# Patient Record
Sex: Male | Born: 1965 | Race: Asian | Hispanic: No | Marital: Married | Smoking: Never smoker
Health system: Southern US, Community
[De-identification: ages and names within clinical notes are randomized; demographics above are authoritative.]

## PROBLEM LIST (undated history)

## (undated) DIAGNOSIS — J45909 Unspecified asthma, uncomplicated: Secondary | ICD-10-CM

## (undated) DIAGNOSIS — E785 Hyperlipidemia, unspecified: Secondary | ICD-10-CM

## (undated) DIAGNOSIS — E119 Type 2 diabetes mellitus without complications: Secondary | ICD-10-CM

## (undated) DIAGNOSIS — I10 Essential (primary) hypertension: Secondary | ICD-10-CM

## (undated) HISTORY — DX: Hyperlipidemia, unspecified: E78.5

## (undated) HISTORY — DX: Type 2 diabetes mellitus without complications: E11.9

## (undated) HISTORY — DX: Unspecified asthma, uncomplicated: J45.909

## (undated) HISTORY — PX: APPENDECTOMY: SHX54

## (undated) HISTORY — DX: Essential (primary) hypertension: I10

---

## 2015-02-25 ENCOUNTER — Other Ambulatory Visit (INDEPENDENT_AMBULATORY_CARE_PROVIDER_SITE_OTHER): Payer: Self-pay | Admitting: General Surgery

## 2015-03-25 ENCOUNTER — Other Ambulatory Visit (INDEPENDENT_AMBULATORY_CARE_PROVIDER_SITE_OTHER): Payer: Self-pay

## 2015-03-25 DIAGNOSIS — Z01818 Encounter for other preprocedural examination: Secondary | ICD-10-CM

## 2015-03-31 ENCOUNTER — Inpatient Hospital Stay (HOSPITAL_COMMUNITY): Admission: RE | Admit: 2015-03-31 | Payer: Self-pay | Source: Ambulatory Visit

## 2015-03-31 ENCOUNTER — Ambulatory Visit (HOSPITAL_COMMUNITY): Admission: RE | Admit: 2015-03-31 | Payer: Self-pay | Source: Ambulatory Visit

## 2015-04-12 ENCOUNTER — Other Ambulatory Visit: Payer: Self-pay

## 2015-04-12 ENCOUNTER — Ambulatory Visit (HOSPITAL_COMMUNITY)
Admission: RE | Admit: 2015-04-12 | Discharge: 2015-04-12 | Disposition: A | Discharge status: Home / Self Care | Payer: 59 | Source: Ambulatory Visit | Attending: General Surgery | Admitting: General Surgery

## 2015-04-12 DIAGNOSIS — J45909 Unspecified asthma, uncomplicated: Secondary | ICD-10-CM | POA: Diagnosis not present

## 2015-04-12 DIAGNOSIS — J9811 Atelectasis: Secondary | ICD-10-CM | POA: Insufficient documentation

## 2015-04-12 DIAGNOSIS — E119 Type 2 diabetes mellitus without complications: Secondary | ICD-10-CM | POA: Diagnosis not present

## 2015-04-13 ENCOUNTER — Encounter: Payer: 59 | Attending: General Surgery | Admitting: Dietician

## 2015-04-13 ENCOUNTER — Encounter: Payer: Self-pay | Admitting: Dietician

## 2015-04-13 DIAGNOSIS — Z6841 Body Mass Index (BMI) 40.0 and over, adult: Secondary | ICD-10-CM | POA: Diagnosis not present

## 2015-04-13 DIAGNOSIS — Z01818 Encounter for other preprocedural examination: Secondary | ICD-10-CM | POA: Diagnosis present

## 2015-04-13 DIAGNOSIS — Z713 Dietary counseling and surveillance: Secondary | ICD-10-CM | POA: Insufficient documentation

## 2015-04-13 NOTE — Progress Notes (Signed)
  Pre-Op Assessment Visit:  Pre-Operative Gastric sleeve Surgery  Medical Nutrition Therapy:  Appt start time: 220   End time: 305  Patient was seen on 04/13/15 for Pre-Operative Nutrition Assessment. Assessment and [facility] on 04/13/15.   Preferred Learning Style:   No preference indicated   Learning Readiness:   Ready  Handouts given during visit include:  Pre-Op Goals Bariatric Surgery Protein Shakes Pre op Diet   During the appointment today the following Pre-Op Goals were reviewed with the patient: Maintain or lose weight as instructed by your surgeon Make healthy food choices Begin to limit portion sizes Limited concentrated sugars and fried foods Keep fat/sugar in the single digits per serving on   food labels Practice CHEWING your food  (aim for 30 chews per bite or until applesauce consistency) Practice not drinking 15 minutes before, during, and 30 minutes after each meal/snack Avoid all carbonated beverages  Avoid/limit caffeinated beverages  Avoid all sugar-sweetened beverages Consume 3 meals per day; eat every 3-5 hours Make a list of non-food related activities Aim for 64-100 ounces of FLUID daily  Aim for at least 60-80 grams of PROTEIN daily Look for a liquid protein source that contain ?15 g protein and ?5 g carbohydrate  (ex: shakes, drinks, shots)  Patient-Centered Goals: -Career -More fit overall  Scale of 1-10: confidence (10) / importance (10)  Demonstrated degree of understanding via:  Teach Back  Teaching Method Utilized:  Visual Auditory Hands on  Barriers to learning/adherence to lifestyle change: none  Patient to call the [facility] to enroll in Pre-Op and Post-Op Nutrition Education when surgery date is scheduled.

## 2015-05-12 ENCOUNTER — Other Ambulatory Visit: Payer: Self-pay | Admitting: General Surgery

## 2015-05-31 ENCOUNTER — Encounter: Payer: 59 | Attending: General Surgery

## 2015-05-31 DIAGNOSIS — Z6841 Body Mass Index (BMI) 40.0 and over, adult: Secondary | ICD-10-CM | POA: Diagnosis not present

## 2015-05-31 DIAGNOSIS — Z713 Dietary counseling and surveillance: Secondary | ICD-10-CM | POA: Diagnosis not present

## 2015-05-31 DIAGNOSIS — Z01818 Encounter for other preprocedural examination: Secondary | ICD-10-CM | POA: Diagnosis not present

## 2015-05-31 NOTE — Progress Notes (Signed)
  Pre-Operative Nutrition Class:  Appt start time: 5430   End time:  1830.  Patient was seen on 05/31/2015 for Pre-Operative Bariatric Surgery Education at the [facility].   Surgery date: 06/28/2015 Surgery type: Gastric sleeve Start weight at [facility]: 259.5 lbs on 04/13/2015 Weight today: 260.5 lbs  TANITA  BODY COMP RESULTS  05/31/15   BMI (kg/m^2) 42   Fat Mass (lbs) 83.5   Fat Free Mass (lbs) 177   Total Body Water (lbs) 129.5   Samples given per MNT protocol. Patient educated on appropriate usage: Celebrate Multivitamin chew (orange - qty 1) Lot #: [id number] Exp: 02/2017  Celebrate Calcium citrate chew (chocolate - qty 1) Lot #: [id number] Exp: 02/2017  Premier protein shake (strawberry - qty 1) Lot #: [id number] Exp: 07/2015  Renee Pain Protein Powder (unflavored - qty 1) Lot #: [id number] Exp: 06/2016  The following the learning objectives were met by the patient during this course:  Identify Pre-Op Dietary Goals and will begin 2 weeks pre-operatively  Identify appropriate sources of fluids and proteins   State protein recommendations and appropriate sources pre and post-operatively  Identify Post-Operative Dietary Goals and will follow for 2 weeks post-operatively  Identify appropriate multivitamin and calcium sources  Describe the need for physical activity post-operatively and will follow MD recommendations  State when to call healthcare provider regarding medication questions or post-operative complications  Handouts given during class include:  Pre-Op Bariatric Surgery Diet Handout  Protein Shake Handout  Post-Op Bariatric Surgery Nutrition Handout  BELT Program Information Flyer  Support Group Information Flyer  [facility] Bariatric Supplements Price List  Follow-Up Plan: Patient will follow-up at [facility] 2 weeks post operatively for diet advancement per MD.

## 2015-06-17 NOTE — Patient Instructions (Addendum)
Paul CamaraRolando Cassara  06/17/2015   Your procedure is scheduled on: Monday 06/28/2015    [facility]  [facility] to 3rd floor to  Short Stay Center at      [phone].  Call this number if you have problems the morning of surgery [phone]   Remember: ONLY 1 PERSON MAY GO WITH YOU TO SHORT STAY TO GET  READY MORNING OF YOUR SURGERY.   Do not eat food or drink liquids :After Midnight.     Take these medicines the morning of surgery with A SIP OF WATER:   Amlodipine ( Norvasc)                               You may not have any metal on your body including hair pins and              piercings  Do not wear jewelry, , lotions, powders or perfumes, deodorant           .              Men may shave face and neck.   Do not bring valuables to the hospital. [facility] IS NOT             RESPONSIBLE   FOR VALUABLES.  Contacts, dentures or bridgework may not be worn into surgery.  Leave suitcase in the car. After surgery it may be brought to your room.     :  Special Instructions: coughing and deep breathing exercises, leg exercises               Please read over the following fact sheets you were given: _____________________________________________________________________             [facility] - Preparing for [facility] Before surgery, you can play an important role.  Because skin is not sterile, your skin needs to be as free of germs as possible.  You can reduce the number of germs on your skin by washing with CHG (chlorahexidine gluconate) soap before surgery.  CHG is an antiseptic cleaner which kills germs and bonds with the skin to continue killing germs even after washing. Please DO NOT use if you have an allergy to CHG or antibacterial soaps.  If your skin becomes reddened/irritated stop using the CHG and inform your nurse when you arrive at Short Stay. Do not shave (including legs and underarms) for at least 48 hours prior to the first  CHG shower.  You may shave your face/neck. Please follow these instructions carefully:  1.  Shower with CHG Soap the night before surgery and the  morning of Surgery.  2.  If you choose to wash your hair, wash your hair first as usual with your  normal  shampoo.  3.  After you shampoo, rinse your hair and body thoroughly to remove the  shampoo.                           4.  Use CHG as you would any other liquid soap.  You can apply chg directly  to the skin and wash                       Gently with a scrungie or clean washcloth.  5.  Apply the CHG Soap to your body ONLY FROM THE NECK DOWN.   Do not use on face/ open                           Wound or open sores. Avoid contact with eyes, ears mouth and genitals (private parts).                       Wash face,  Genitals (private parts) with your normal soap.             6.  Wash thoroughly, paying special attention to the area where your surgery  will be performed.  7.  Thoroughly rinse your body with warm water from the neck down.  8.  DO NOT shower/wash with your normal soap after using and rinsing off  the CHG Soap.                9.  Pat yourself dry with a clean towel.            10.  Wear clean pajamas.            11.  Place clean sheets on your bed the night of your first shower and do not  sleep with pets. Day of Surgery : Do not apply any lotions/deodorants the morning of surgery.  Please wear clean clothes to the hospital/surgery center.  FAILURE TO FOLLOW THESE INSTRUCTIONS MAY RESULT IN THE CANCELLATION OF YOUR SURGERY PATIENT SIGNATURE_________________________________  NURSE SIGNATURE__________________________________  ________________________________________________________________________

## 2015-06-22 ENCOUNTER — Encounter (HOSPITAL_COMMUNITY): Payer: Self-pay

## 2015-06-22 ENCOUNTER — Encounter (HOSPITAL_COMMUNITY)
Admission: RE | Admit: 2015-06-22 | Discharge: 2015-06-22 | Disposition: A | Discharge status: Home / Self Care | Payer: 59 | Source: Ambulatory Visit | Attending: General Surgery | Admitting: General Surgery

## 2015-06-22 DIAGNOSIS — Z01812 Encounter for preprocedural laboratory examination: Secondary | ICD-10-CM | POA: Insufficient documentation

## 2015-06-22 LAB — CBC WITH DIFFERENTIAL/PLATELET
Basophils Absolute: 0 10*3/uL (ref 0.0–0.1)
Basophils Relative: 1 % (ref 0–1)
Eosinophils Absolute: 0.2 10*3/uL (ref 0.0–0.7)
Eosinophils Relative: 4 % (ref 0–5)
HEMATOCRIT: 48 % (ref 39.0–52.0)
HEMOGLOBIN: 15.4 g/dL (ref 13.0–17.0)
LYMPHS PCT: 22 % (ref 12–46)
Lymphs Abs: 1.3 10*3/uL (ref 0.7–4.0)
MCH: 25.1 pg — ABNORMAL LOW (ref 26.0–34.0)
MCHC: 32.1 g/dL (ref 30.0–36.0)
MCV: 78.3 fL (ref 78.0–100.0)
MONO ABS: 0.5 10*3/uL (ref 0.1–1.0)
MONOS PCT: 8 % (ref 3–12)
NEUTROS PCT: 65 % (ref 43–77)
Neutro Abs: 3.9 10*3/uL (ref 1.7–7.7)
Platelets: 188 10*3/uL (ref 150–400)
RBC: 6.13 MIL/uL — ABNORMAL HIGH (ref 4.22–5.81)
RDW: 15.8 % — ABNORMAL HIGH (ref 11.5–15.5)
WBC: 5.9 10*3/uL (ref 4.0–10.5)

## 2015-06-22 LAB — COMPREHENSIVE METABOLIC PANEL
ALT: 69 U/L — AB (ref 17–63)
AST: 35 U/L (ref 15–41)
Albumin: 4.2 g/dL (ref 3.5–5.0)
Alkaline Phosphatase: 40 U/L (ref 38–126)
Anion gap: 10 (ref 5–15)
BUN: 22 mg/dL — ABNORMAL HIGH (ref 6–20)
CO2: 27 mmol/L (ref 22–32)
Calcium: 9.1 mg/dL (ref 8.9–10.3)
Chloride: 100 mmol/L — ABNORMAL LOW (ref 101–111)
Creatinine, Ser: 1.03 mg/dL (ref 0.61–1.24)
GFR calc Af Amer: >60 mL/min (ref >60)
GFR calc non Af Amer: >60 mL/min (ref >60)
Glucose, Bld: 248 mg/dL — ABNORMAL HIGH (ref 65–99)
Potassium: 4.2 mmol/L (ref 3.5–5.1)
Sodium: 137 mmol/L (ref 135–145)
TOTAL PROTEIN: 7 g/dL (ref 6.5–8.1)
Total Bilirubin: 0.7 mg/dL (ref 0.3–1.2)

## 2015-06-22 NOTE — Progress Notes (Signed)
   06/22/15 1135  OBSTRUCTIVE SLEEP APNEA  Have you ever been diagnosed with sleep apnea through a sleep study? No  Do you snore loudly (loud enough to be heard through closed doors)?  1  Do you often feel tired, fatigued, or sleepy during the daytime? 0  Has anyone observed you stop breathing during your sleep? 0  Do you have, or are you being treated for high blood pressure? 1  BMI more than 35 kg/m2? 1  Age over 50 years old? 0  Neck circumference greater than 40 cm/16 inches? 1  Gender: 1

## 2015-06-22 NOTE — Progress Notes (Signed)
04/12/15-noted EKG and CXR in EPIC

## 2015-06-25 NOTE — H&P (Signed)
  History of Present Illness Paul Le(Talyn Dessert T. Nethaniel Mattie MD; 06/11/2015 11:40 AM) The patient is a 49 year old male who presents with obesity. He returns for a preop visit prior to planned sleeve gastrectomy for morbid obesity and comorbidities of adult-onset diabetes mellitus, hyperlipidemia and lower extremity edema. He has successfully completed his preoperative workup. His upper GI series was unremarkable. No concerns on Cycrin nutrition evaluation. We reviewed his lab work which was consistent with somewhat poorly controlled diabetes with elevated hemoglobin A1c of 9.5 and elevated glucose.   Allergies Paul Le(Ashley Beck, CMA; 06/11/2015 11:24 AM) No Known Drug Allergies03/09/2015  Medication History Paul Le(Ashley Beck, CMA; 06/11/2015 11:24 AM) Resveratrol (100MG  Capsule, Oral) Active. Hydrochlorothiazide (25MG  Tablet, Oral daily) Active. Amlodipine-Atorvastatin (10-20MG  Tablet, Oral) Active. Omega 3 (1000MG  Capsule, Oral) Active. Atorvastatin Calcium (10MG  Tablet, Oral) Active. MetFORMIN HCl (1000MG  Tablet, Oral) Active. Sleep Aid (DiphenhydrAMINE) (25MG  Tablet, Oral) Active. Meloxicam (7.5MG  Tablet, Oral) Active. Metoprolol Tartrate (50MG  Tablet, Oral) Active. B12-Active (1MG  Tablet Chewable, Oral) Active. Vitamin C (Oral) Active. Medications Reconciled  Vitals Paul Le(Ashley Beck CMA; 06/11/2015 11:24 AM) 06/11/2015 11:24 AM Weight: 260.8 lb Height: 66in Body Surface Area: 2.35 m Body Mass Index: 42.09 kg/m Temp.: 97.65F(Oral)  Pulse: 60 (Regular)  Resp.: 17 (Unlabored)  BP: 130/70 (Sitting, Left Arm, Standard)    Physical Exam Paul Le(Robertson Colclough T. Semisi Biela MD; 06/11/2015 11:41 AM) The physical exam findings are as follows: Note:General: Alert, morbidly obese Asian male, in no distress Skin: Warm and dry without rash or infection. HEENT: No palpable masses or thyromegaly. Sclera nonicteric. Pupils equal round and reactive. Oropharynx clear. Lungs: Breath sounds clear and  equal. No wheezing or increased work of breathing. Cardiovascular: Regular rate and rhythm without murmer. No JVD or edema. Peripheral pulses intact. No carotid bruits. Abdomen: Nondistended. Soft and nontender. No masses palpable. No organomegaly. No palpable hernias. Neurologic: Alert and fully oriented. Gait normal. No focal weakness. Psychiatric: Normal mood and affect. Thought content appropriate with normal judgement and insight    Assessment & Plan Paul Le(Avon Molock T. La Shehan MD; 06/11/2015 11:41 AM) MORBID OBESITY WITH BMI OF 40.0-44.9, ADULT (278.01  E66.01) Impression: Patient with progressive morbid obesity unresponsive to multiple efforts at medical management who presents with a BMI of 42 and comorbidities of diabetes mellitus and lower extremity edema. Workup complete and ready to proceed with laparoscopic sleeve gastrectomy. We reviewed the procedure and recovery in detail and all his questions were answered. Consent form was reviewed. He is given prescription for pain medication

## 2015-06-28 ENCOUNTER — Inpatient Hospital Stay (HOSPITAL_COMMUNITY): Payer: 59 | Admitting: Certified Registered Nurse Anesthetist

## 2015-06-28 ENCOUNTER — Inpatient Hospital Stay (HOSPITAL_COMMUNITY)
Admission: RE | Admit: 2015-06-28 | Discharge: 2015-06-30 | DRG: 621 | Disposition: A | Discharge status: Home / Self Care | Payer: 59 | Source: Ambulatory Visit | Attending: General Surgery | Admitting: General Surgery

## 2015-06-28 ENCOUNTER — Encounter (HOSPITAL_COMMUNITY)
Admission: RE | Disposition: A | Discharge status: Home / Self Care | Payer: Self-pay | Source: Ambulatory Visit | Attending: General Surgery

## 2015-06-28 ENCOUNTER — Encounter (HOSPITAL_COMMUNITY): Payer: Self-pay | Admitting: *Deleted

## 2015-06-28 DIAGNOSIS — K209 Esophagitis, unspecified: Secondary | ICD-10-CM | POA: Diagnosis present

## 2015-06-28 DIAGNOSIS — I10 Essential (primary) hypertension: Secondary | ICD-10-CM | POA: Diagnosis present

## 2015-06-28 DIAGNOSIS — E780 Pure hypercholesterolemia: Secondary | ICD-10-CM | POA: Diagnosis present

## 2015-06-28 DIAGNOSIS — Z794 Long term (current) use of insulin: Secondary | ICD-10-CM

## 2015-06-28 DIAGNOSIS — E1165 Type 2 diabetes mellitus with hyperglycemia: Secondary | ICD-10-CM | POA: Diagnosis present

## 2015-06-28 DIAGNOSIS — Z9884 Bariatric surgery status: Secondary | ICD-10-CM

## 2015-06-28 DIAGNOSIS — Z6841 Body Mass Index (BMI) 40.0 and over, adult: Secondary | ICD-10-CM | POA: Diagnosis not present

## 2015-06-28 DIAGNOSIS — R600 Localized edema: Secondary | ICD-10-CM | POA: Diagnosis present

## 2015-06-28 DIAGNOSIS — E785 Hyperlipidemia, unspecified: Secondary | ICD-10-CM | POA: Diagnosis present

## 2015-06-28 HISTORY — PX: LAPAROSCOPIC GASTRIC SLEEVE RESECTION: SHX5895

## 2015-06-28 LAB — GLUCOSE, CAPILLARY
Glucose-Capillary: 174 mg/dL — ABNORMAL HIGH (ref 65–99)
Glucose-Capillary: 263 mg/dL — ABNORMAL HIGH (ref 65–99)
Glucose-Capillary: 231 mg/dL — ABNORMAL HIGH (ref 65–99)
Glucose-Capillary: 205 mg/dL — ABNORMAL HIGH (ref 65–99)

## 2015-06-28 LAB — HEMOGLOBIN AND HEMATOCRIT, BLOOD
HEMATOCRIT: 48.3 % (ref 39.0–52.0)
HEMOGLOBIN: 16 g/dL (ref 13.0–17.0)

## 2015-06-28 SURGERY — GASTRECTOMY, SLEEVE, LAPAROSCOPIC
Anesthesia: General | Site: Abdomen

## 2015-06-28 MED ORDER — INSULIN ASPART 100 UNIT/ML ~~LOC~~ SOLN
0.0000 [IU] | SUBCUTANEOUS | Status: DC
  Administered 2015-06-28 (×2): 7 [IU] via SUBCUTANEOUS
  Administered 2015-06-29: 4 [IU] via SUBCUTANEOUS
  Administered 2015-06-29: 3 [IU] via SUBCUTANEOUS
  Administered 2015-06-29: 4 [IU] via SUBCUTANEOUS
  Administered 2015-06-29 – 2015-06-30 (×4): 3 [IU] via SUBCUTANEOUS
  Administered 2015-06-30: 4 [IU] via SUBCUTANEOUS
  Administered 2015-06-30: 3 [IU] via SUBCUTANEOUS

## 2015-06-28 MED ORDER — PHENYLEPHRINE 40 MCG/ML (10ML) SYRINGE FOR IV PUSH (FOR BLOOD PRESSURE SUPPORT)
PREFILLED_SYRINGE | INTRAVENOUS | Status: AC
  Filled 2015-06-28: qty 10

## 2015-06-28 MED ORDER — HEPARIN SODIUM (PORCINE) 5000 UNIT/ML IJ SOLN
5000.0000 [IU] | INTRAMUSCULAR | Status: AC
  Administered 2015-06-28: 5000 [IU] via SUBCUTANEOUS
  Filled 2015-06-28: qty 1

## 2015-06-28 MED ORDER — LACTATED RINGERS IV SOLN
INTRAVENOUS | Status: DC
  Administered 2015-06-28: 1000 mL via INTRAVENOUS

## 2015-06-28 MED ORDER — PROPOFOL 10 MG/ML IV BOLUS
INTRAVENOUS | Status: DC | PRN
  Administered 2015-06-28: 200 mg via INTRAVENOUS

## 2015-06-28 MED ORDER — UNJURY CHICKEN SOUP POWDER: 2.0000 [oz_av] | Freq: Four times a day (QID) | ORAL | Status: DC

## 2015-06-28 MED ORDER — GLYCOPYRROLATE 0.2 MG/ML IJ SOLN
INTRAMUSCULAR | Status: DC | PRN
  Administered 2015-06-28: 0.6 mg via INTRAVENOUS

## 2015-06-28 MED ORDER — METOPROLOL TARTRATE 1 MG/ML IV SOLN
5.0000 mg | Freq: Four times a day (QID) | INTRAVENOUS | Status: DC
  Administered 2015-06-28 – 2015-06-30 (×7): 5 mg via INTRAVENOUS
  Filled 2015-06-28 (×11): qty 5

## 2015-06-28 MED ORDER — ACETAMINOPHEN 160 MG/5ML PO SOLN: 650.0000 mg | ORAL | Status: DC | PRN

## 2015-06-28 MED ORDER — FENTANYL CITRATE (PF) 100 MCG/2ML IJ SOLN
INTRAMUSCULAR | Status: DC | PRN
  Administered 2015-06-28 (×5): 50 ug via INTRAVENOUS

## 2015-06-28 MED ORDER — PHENYLEPHRINE HCL 10 MG/ML IJ SOLN
INTRAMUSCULAR | Status: DC | PRN
  Administered 2015-06-28 (×3): 80 ug via INTRAVENOUS

## 2015-06-28 MED ORDER — EPHEDRINE SULFATE 50 MG/ML IJ SOLN
INTRAMUSCULAR | Status: DC | PRN
  Administered 2015-06-28 (×2): 10 mg via INTRAVENOUS

## 2015-06-28 MED ORDER — PROPOFOL 10 MG/ML IV BOLUS
INTRAVENOUS | Status: AC
  Filled 2015-06-28: qty 20

## 2015-06-28 MED ORDER — ONDANSETRON HCL 4 MG/2ML IJ SOLN
INTRAMUSCULAR | Status: AC
  Filled 2015-06-28: qty 2

## 2015-06-28 MED ORDER — DEXAMETHASONE SODIUM PHOSPHATE 10 MG/ML IJ SOLN
INTRAMUSCULAR | Status: DC | PRN
  Administered 2015-06-28: 10 mg via INTRAVENOUS

## 2015-06-28 MED ORDER — NEOSTIGMINE METHYLSULFATE 10 MG/10ML IV SOLN
INTRAVENOUS | Status: DC | PRN
  Administered 2015-06-28: 5 mg via INTRAVENOUS

## 2015-06-28 MED ORDER — METOCLOPRAMIDE HCL 5 MG/ML IJ SOLN
INTRAMUSCULAR | Status: DC | PRN
  Administered 2015-06-28: 10 mg via INTRAVENOUS

## 2015-06-28 MED ORDER — CHLORHEXIDINE GLUCONATE CLOTH 2 % EX PADS: 6.0000 | MEDICATED_PAD | Freq: Once | CUTANEOUS | Status: DC

## 2015-06-28 MED ORDER — UNJURY VANILLA POWDER: 2.0000 [oz_av] | Freq: Four times a day (QID) | ORAL | Status: DC

## 2015-06-28 MED ORDER — BUPIVACAINE-EPINEPHRINE 0.25% -1:200000 IJ SOLN
INTRAMUSCULAR | Status: DC | PRN
  Administered 2015-06-28: 23 mL

## 2015-06-28 MED ORDER — OXYCODONE HCL 5 MG/5ML PO SOLN
5.0000 mg | ORAL | Status: DC | PRN
  Administered 2015-06-29 – 2015-06-30 (×3): 5 mg via ORAL
  Filled 2015-06-28 (×3): qty 5

## 2015-06-28 MED ORDER — POTASSIUM CHLORIDE IN NACL 20-0.9 MEQ/L-% IV SOLN
INTRAVENOUS | Status: DC
  Administered 2015-06-28 – 2015-06-29 (×2): via INTRAVENOUS
  Administered 2015-06-29: 1000 mL via INTRAVENOUS
  Administered 2015-06-29 – 2015-06-30 (×2): via INTRAVENOUS
  Filled 2015-06-28 (×6): qty 1000

## 2015-06-28 MED ORDER — ENOXAPARIN SODIUM 30 MG/0.3ML ~~LOC~~ SOLN
30.0000 mg | Freq: Two times a day (BID) | SUBCUTANEOUS | Status: DC
  Administered 2015-06-28 – 2015-06-30 (×4): 30 mg via SUBCUTANEOUS
  Filled 2015-06-28 (×5): qty 0.3

## 2015-06-28 MED ORDER — UNJURY CHOCOLATE CLASSIC POWDER
2.0000 [oz_av] | Freq: Four times a day (QID) | ORAL | Status: DC
  Administered 2015-06-30: 2 [oz_av] via ORAL

## 2015-06-28 MED ORDER — DEXAMETHASONE SODIUM PHOSPHATE 10 MG/ML IJ SOLN
INTRAMUSCULAR | Status: AC
  Filled 2015-06-28: qty 1

## 2015-06-28 MED ORDER — ACETAMINOPHEN 160 MG/5ML PO SOLN: 325.0000 mg | ORAL | Status: DC | PRN

## 2015-06-28 MED ORDER — MIDAZOLAM HCL 5 MG/5ML IJ SOLN
INTRAMUSCULAR | Status: DC | PRN
  Administered 2015-06-28: 2 mg via INTRAVENOUS

## 2015-06-28 MED ORDER — LIDOCAINE HCL (CARDIAC) 20 MG/ML IV SOLN
INTRAVENOUS | Status: DC | PRN
  Administered 2015-06-28: 100 mg via INTRAVENOUS

## 2015-06-28 MED ORDER — MORPHINE SULFATE 2 MG/ML IJ SOLN
2.0000 mg | INTRAMUSCULAR | Status: DC | PRN
  Administered 2015-06-28 – 2015-06-29 (×8): 2 mg via INTRAVENOUS
  Filled 2015-06-28 (×8): qty 1

## 2015-06-28 MED ORDER — BUPIVACAINE-EPINEPHRINE (PF) 0.25% -1:200000 IJ SOLN
INTRAMUSCULAR | Status: AC
  Filled 2015-06-28: qty 30

## 2015-06-28 MED ORDER — HYDROMORPHONE HCL 1 MG/ML IJ SOLN: 0.2500 mg | INTRAMUSCULAR | Status: DC | PRN

## 2015-06-28 MED ORDER — OXYCODONE HCL 5 MG PO TABS: 5.0000 mg | ORAL_TABLET | Freq: Once | ORAL | Status: DC | PRN

## 2015-06-28 MED ORDER — DEXTROSE 5 % IV SOLN
2.0000 g | INTRAVENOUS | Status: AC
  Administered 2015-06-28: 2 g via INTRAVENOUS

## 2015-06-28 MED ORDER — LACTATED RINGERS IR SOLN
Status: DC | PRN
  Administered 2015-06-28: 1000 mL

## 2015-06-28 MED ORDER — STERILE WATER FOR IRRIGATION IR SOLN
Status: DC | PRN
Start: 2015-06-28 — End: 2015-06-28
  Administered 2015-06-28: 1500 mL

## 2015-06-28 MED ORDER — DEXTROSE 5 % IV SOLN
INTRAVENOUS | Status: AC
  Filled 2015-06-28: qty 2

## 2015-06-28 MED ORDER — ONDANSETRON HCL 4 MG/2ML IJ SOLN
INTRAMUSCULAR | Status: DC | PRN
  Administered 2015-06-28: 4 mg via INTRAVENOUS

## 2015-06-28 MED ORDER — OXYCODONE HCL 5 MG/5ML PO SOLN
5.0000 mg | Freq: Once | ORAL | Status: DC | PRN
  Filled 2015-06-28: qty 5

## 2015-06-28 MED ORDER — SODIUM CHLORIDE 0.9 % IJ SOLN
INTRAMUSCULAR | Status: AC
  Filled 2015-06-28: qty 10

## 2015-06-28 MED ORDER — PANTOPRAZOLE SODIUM 40 MG IV SOLR
40.0000 mg | Freq: Every day | INTRAVENOUS | Status: DC
  Administered 2015-06-28 – 2015-06-29 (×2): 40 mg via INTRAVENOUS
  Filled 2015-06-28 (×3): qty 40

## 2015-06-28 MED ORDER — PHENYLEPHRINE HCL 10 MG/ML IJ SOLN
10.0000 mg | INTRAMUSCULAR | Status: DC | PRN
  Administered 2015-06-28: 50 ug/min via INTRAVENOUS

## 2015-06-28 MED ORDER — ROCURONIUM BROMIDE 100 MG/10ML IV SOLN
INTRAVENOUS | Status: DC | PRN
  Administered 2015-06-28: 50 mg via INTRAVENOUS
  Administered 2015-06-28: 10 mg via INTRAVENOUS

## 2015-06-28 MED ORDER — LIDOCAINE HCL (CARDIAC) 20 MG/ML IV SOLN
INTRAVENOUS | Status: AC
  Filled 2015-06-28: qty 5

## 2015-06-28 MED ORDER — ROCURONIUM BROMIDE 100 MG/10ML IV SOLN
INTRAVENOUS | Status: AC
  Filled 2015-06-28: qty 1

## 2015-06-28 MED ORDER — TISSEEL VH 10 ML EX KIT
PACK | CUTANEOUS | Status: AC
  Filled 2015-06-28: qty 1

## 2015-06-28 MED ORDER — FENTANYL CITRATE (PF) 250 MCG/5ML IJ SOLN
INTRAMUSCULAR | Status: AC
  Filled 2015-06-28: qty 5

## 2015-06-28 MED ORDER — GLYCOPYRROLATE 0.2 MG/ML IJ SOLN
INTRAMUSCULAR | Status: AC
  Filled 2015-06-28: qty 3

## 2015-06-28 MED ORDER — ONDANSETRON HCL 4 MG/2ML IJ SOLN: 4.0000 mg | Freq: Four times a day (QID) | INTRAMUSCULAR | Status: DC | PRN

## 2015-06-28 MED ORDER — SUCCINYLCHOLINE CHLORIDE 20 MG/ML IJ SOLN
INTRAMUSCULAR | Status: DC | PRN
  Administered 2015-06-28: 100 mg via INTRAVENOUS

## 2015-06-28 MED ORDER — ONDANSETRON HCL 4 MG/2ML IJ SOLN
4.0000 mg | INTRAMUSCULAR | Status: DC | PRN
  Administered 2015-06-29 – 2015-06-30 (×3): 4 mg via INTRAVENOUS
  Filled 2015-06-28 (×3): qty 2

## 2015-06-28 MED ORDER — METOCLOPRAMIDE HCL 5 MG/ML IJ SOLN
INTRAMUSCULAR | Status: AC
  Filled 2015-06-28: qty 2

## 2015-06-28 MED ORDER — 0.9 % SODIUM CHLORIDE (POUR BTL) OPTIME
TOPICAL | Status: DC | PRN
  Administered 2015-06-28: 1000 mL

## 2015-06-28 MED ORDER — EPHEDRINE SULFATE 50 MG/ML IJ SOLN
INTRAMUSCULAR | Status: AC
  Filled 2015-06-28: qty 1

## 2015-06-28 MED ORDER — PHENYLEPHRINE HCL 10 MG/ML IJ SOLN
INTRAMUSCULAR | Status: AC
  Filled 2015-06-28: qty 1

## 2015-06-28 MED ORDER — TISSEEL VH 10 ML EX KIT
PACK | CUTANEOUS | Status: DC | PRN
  Administered 2015-06-28: 1

## 2015-06-28 MED ORDER — MIDAZOLAM HCL 2 MG/2ML IJ SOLN
INTRAMUSCULAR | Status: AC
  Filled 2015-06-28: qty 2

## 2015-06-28 SURGICAL SUPPLY — 54 items
APPLICATOR COTTON TIP 6IN STRL (MISCELLANEOUS) IMPLANT
APPLIER CLIP ROT 10 11.4 M/L (STAPLE) ×3
APPLIER CLIP ROT 13.4 12 LRG (CLIP)
BLADE SURG SZ11 CARB STEEL (BLADE) ×3 IMPLANT
CABLE HIGH FREQUENCY MONO STRZ (ELECTRODE) ×3 IMPLANT
CHLORAPREP W/TINT 26ML (MISCELLANEOUS) ×3 IMPLANT
CLIP APPLIE ROT 10 11.4 M/L (STAPLE) ×1 IMPLANT
CLIP APPLIE ROT 13.4 12 LRG (CLIP) IMPLANT
DEVICE SUTURE ENDOST 10MM (ENDOMECHANICALS) IMPLANT
DEVICE TROCAR PUNCTURE CLOSURE (ENDOMECHANICALS) ×3 IMPLANT
DRAPE CAMERA CLOSED 9X96 (DRAPES) ×3 IMPLANT
DRAPE UTILITY XL STRL (DRAPES) ×6 IMPLANT
ELECT REM PT RETURN 9FT ADLT (ELECTROSURGICAL) ×3
ELECTRODE REM PT RTRN 9FT ADLT (ELECTROSURGICAL) ×1 IMPLANT
GAUZE SPONGE 4X4 12PLY STRL (GAUZE/BANDAGES/DRESSINGS) IMPLANT
GLOVE BIOGEL PI IND STRL 7.5 (GLOVE) ×1 IMPLANT
GLOVE BIOGEL PI INDICATOR 7.5 (GLOVE) ×2
GLOVE ECLIPSE 7.5 STRL STRAW (GLOVE) ×3 IMPLANT
GOWN STRL REUS W/TWL XL LVL3 (GOWN DISPOSABLE) ×12 IMPLANT
HOVERMATT SINGLE USE (MISCELLANEOUS) ×3 IMPLANT
KIT BASIN OR (CUSTOM PROCEDURE TRAY) ×3 IMPLANT
LIQUID BAND (GAUZE/BANDAGES/DRESSINGS) ×3 IMPLANT
NEEDLE SPNL 22GX3.5 QUINCKE BK (NEEDLE) ×3 IMPLANT
PACK UNIVERSAL I (CUSTOM PROCEDURE TRAY) ×3 IMPLANT
PEN SKIN MARKING BROAD (MISCELLANEOUS) ×3 IMPLANT
POUCH SPECIMEN RETRIEVAL 10MM (ENDOMECHANICALS) IMPLANT
RELOAD STAPLER BLUE 60MM (STAPLE) ×1 IMPLANT
RELOAD STAPLER GOLD 60MM (STAPLE) ×1 IMPLANT
RELOAD STAPLER GREEN 60MM (STAPLE) ×3 IMPLANT
SCISSORS LAP 5X45 EPIX DISP (ENDOMECHANICALS) ×3 IMPLANT
SEALANT SURGICAL APPL DUAL CAN (MISCELLANEOUS) ×3 IMPLANT
SET IRRIG TUBING LAPAROSCOPIC (IRRIGATION / IRRIGATOR) ×3 IMPLANT
SHEARS CURVED HARMONIC AC 45CM (MISCELLANEOUS) ×3 IMPLANT
SLEEVE ADV FIXATION 5X100MM (TROCAR) ×3 IMPLANT
SLEEVE GASTRECTOMY 36FR VISIGI (MISCELLANEOUS) ×3 IMPLANT
SOLUTION ANTI FOG 6CC (MISCELLANEOUS) ×3 IMPLANT
SPONGE LAP 18X18 X RAY DECT (DISPOSABLE) ×3 IMPLANT
STAPLER ECHELON LONG 60 440 (INSTRUMENTS) ×3 IMPLANT
STAPLER RELOAD BLUE 60MM (STAPLE) ×3
STAPLER RELOAD GOLD 60MM (STAPLE) ×3
STAPLER RELOAD GREEN 60MM (STAPLE) ×9
SUT MNCRL AB 4-0 PS2 18 (SUTURE) ×3 IMPLANT
SUT VICRYL 0 TIES 12 18 (SUTURE) ×3 IMPLANT
SYR 10ML ECCENTRIC (SYRINGE) ×3 IMPLANT
SYR 20CC LL (SYRINGE) ×3 IMPLANT
TOWEL OR 17X26 10 PK STRL BLUE (TOWEL DISPOSABLE) ×3 IMPLANT
TOWEL OR NON WOVEN STRL DISP B (DISPOSABLE) ×3 IMPLANT
TROCAR ADV FIXATION 5X100MM (TROCAR) ×3 IMPLANT
TROCAR BLADELESS 15MM (ENDOMECHANICALS) ×3 IMPLANT
TROCAR XCEL NON-BLD 5MMX100MML (ENDOMECHANICALS) ×3 IMPLANT
TUBING CONNECTING 10 (TUBING) ×4 IMPLANT
TUBING CONNECTING 10' (TUBING) ×2
TUBING ENDO SMARTCAP PENTAX (MISCELLANEOUS) ×3 IMPLANT
TUBING FILTER THERMOFLATOR (ELECTROSURGICAL) ×3 IMPLANT

## 2015-06-28 NOTE — Transfer of Care (Signed)
Immediate Anesthesia Transfer of Care Note  Patient: Paul CamaraRolando Cabezas  Procedure(s) Performed: Procedure(s): LAPAROSCOPIC GASTRIC SLEEVE RESECTION WITH UPPER ENDOSCOPY (N/A)  Patient Location: PACU  Anesthesia Type:General  Level of Consciousness:  sedated, patient cooperative and responds to stimulation  Airway & Oxygen Therapy:Patient Spontanous Breathing and Patient connected to face mask oxgen  Post-op Assessment:  Report given to PACU RN and Post -op Vital signs reviewed and stable  Post vital signs:  Reviewed and stable  Last Vitals:  Filed Vitals:   06/28/15 0842  BP: 139/92  Pulse: 87  Temp: 36.6 C  Resp: 18    Complications: No apparent anesthesia complications

## 2015-06-28 NOTE — Op Note (Signed)
Preoperative Diagnosis: morbid obesity  Postoprative Diagnosis: morbid obesity  Procedure: Procedure(s): LAPAROSCOPIC GASTRIC SLEEVE RESECTION WITH UPPER ENDOSCOPY   Surgeon: Glenna FellowsHoxworth, Janaya Broy T   Assistants: Ovidio Kinavid Newman  Anesthesia:  General endotracheal anesthesia  Indications: Patient is a 49 year old male with progressive morbid obesity unresponsive to medical management of multiple comorbidities including insulin-dependent diabetes mellitus and hypertension. After extensive preoperative workup and discussion regarding alternatives and risks all detailed elsewhere with elected proceed with laparoscopic sleeve gastrectomy for initial surgical treatment for his morbid obesity.    Procedure Detail:  Patient was brought to the operating room, placed in supine position on the operating table, and general endotracheal anesthesia induced. PAS were in place. He received preoperative subcutaneous heparin. He was given preoperative IV antibiotics. The abdomen was widely sterilely prepped and draped. Patient timeout was performed and correct procedure verified. Access was obtained to the left upper quadrant midclavicular line with a 5 mm Optiview trocar without difficulty and pneumoperitoneum established. Under direct vision a 5 mm trocar was placed laterally in the right upper quadrant, a 15 mm trocar in the right upper quadrant near the base of the falciform ligament and a 5 mm trocar above to the left of the umbilicus for the camera port. Through a 5 mm subxiphoid site and a sternal retractor was placed in the left lobe liver elevated with good exposure of the hiatus and stomach. The patient was placed in steep reverse Trendelenburg. One additional 5 mm port was placed laterally in the left upper quadrant. The stomach and omentum appeared unremarkable. The pylorus was identified and we measured 5-6 cm from the pylorus. At this point the dissection along the greater curve was begun with the Harmonic  scalpel dividing branches from the gastroepiploic. The dissection progressed proximally along the stomach and the lesser sac was entered. The dissection progressed proximally dividing short gastric vessels. The fundus was mobilized and short gastrics divided adjacent to the spleen and we worked back along the left crus completely mobilizing the cardia and this point the stomach was completely mobilized along its lesser curve vasculature. A few further vascular attachments distally were taken down a preparation for the first staple firing at about 5-6 cm from the pylorus. The VisiG gastric tube was inserted orally and under direct vision passed distally with its tip toward the pylorus. It was positioned along the lesser curve and then suction applied with good fixation of the stomach symmetrically. An initial firing using the green load 60 mm echelon stapler was performed along the greater curve at 5-6 cm from the pylorus angling up staying away from the incisura. A second firing of the green load stapler was performed going past the incisura and keeping at least a centimeter away from the gastric tube. Several further firings of echelon 60 mm stapler using green Gold and blue loads were performed adjacent to the gastric tube but not tight against it working up toward the hiatus with the final firing going just lateral to the periesophageal fat pad and completed the gastrectomy. With the stomach insufflated with air and under saline irrigation was no evidence of leak. Dr. Ezzard StandingNewman performed upper endoscopy with again no evidence of leak and a symmetrical appearing sleeve. There was a little oozing at several points along the staple line which were controlled with clips. Complete hemostasis was obtained. The staple line was completely coated with Tisseel tissue sealant. Through the 15 mm trocar site after dilating it slightly the gastrectomy specimen was removed intact. The fascial defect  this site was closed with  several 0 Vicryl sutures using the Endo Close. The operative site was inspected and there was no bleeding or evidence of injury or other problems. All CO2 was evacuated after removing the Nathanson retractor under direct vision and trochars removed. Skin incisions were closed with subcuticular Monocryl and Dermabond.    Findings: As above  Estimated Blood Loss:  less than 100 mL         Drains: none  Blood Given: none          Specimens: Sleeve gastrectomy        Complications:  * No complications entered in OR log *         Disposition: PACU - hemodynamically stable.         Condition: stable

## 2015-06-28 NOTE — Anesthesia Procedure Notes (Signed)
Procedure Name: Intubation Date/Time: 06/28/2015 11:33 AM Performed by: Orest DikesPETERS, Camaya Gannett J Pre-anesthesia Checklist: Patient identified, Emergency Drugs available, Suction available and Patient being monitored Patient Re-evaluated:Patient Re-evaluated prior to inductionOxygen Delivery Method: Circle System Utilized Preoxygenation: Pre-oxygenation with 100% oxygen Intubation Type: IV induction Ventilation: Mask ventilation without difficulty Laryngoscope Size: Mac and 4 Grade View: Grade II Tube type: Oral Tube size: 7.0 mm Number of attempts: 1 Airway Equipment and Method: Stylet and Oral airway Placement Confirmation: ETT inserted through vocal cords under direct vision,  positive ETCO2 and breath sounds checked- equal and bilateral Secured at: 21 cm Tube secured with: Tape Dental Injury: Teeth and Oropharynx as per pre-operative assessment

## 2015-06-28 NOTE — Anesthesia Postprocedure Evaluation (Signed)
Anesthesia Post Note  Patient: Paul Le  Procedure(s) Performed: Procedure(s) (LRB): LAPAROSCOPIC GASTRIC SLEEVE RESECTION WITH UPPER ENDOSCOPY (N/A)  Anesthesia type: General  Patient location: PACU  Post pain: Pain level controlled and Adequate analgesia  Post assessment: Post-op Vital signs reviewed, Patient's Cardiovascular Status Stable, Respiratory Function Stable, Patent Airway and Pain level controlled  Last Vitals:  Filed Vitals:   06/28/15 1720  BP: 123/75  Pulse: 98  Temp: 36.8 C  Resp: 16    Post vital signs: Reviewed and stable  Level of consciousness: awake, alert  and oriented  Complications: No apparent anesthesia complications

## 2015-06-28 NOTE — Op Note (Signed)
Name:  Paul Le Podgurski MRN: [id number] Date of Surgery: 06/28/2015  Preop Diagnosis:  Morbid Obesity  Postop Diagnosis:  Morbid Obesity (Weight - 260, BMI - 42.09) , S/P Gastric Sleeve, mild to moderate esophagitis at GE junction  Procedure:  Upper endoscopy  (Intraoperative)  Surgeon:  Ovidio Kinavid Brayson Livesey, M.D.  Anesthesia:  GET  Indications for procedure: Paul Le Chiang is a 49 y.o. male whose primary care physician is Gus RankinMelvin, Casey N, PA-C and has completed a Gastric Sleeve today by Dr. Johna SheriffHoxworth.  I am doing an intraoperative upper endoscopy to evaluate the gastric pouch.  Operative Note: The patient is under general anesthesia.  Dr. Johna SheriffHoxworth is laparoscoping the patient while I do an upper endoscopy to evaluate the stomach pouch.  With the patient intubated, I passed the Pentax upper endoscope without difficulty down the esophagus.  The esophago-gastric junction was at 40 cm.   He does have some mild to moderate esophagitis at his GE junction.  The mucosa of the stomach looked viable and the staple line was intact without bleeding.  I advanced to the pylorus, but did not go through it.  While I insufflated the stomach pouch with air, Dr. Johna SheriffHoxworth  flooded the upper abdomen with saline to put the gastric pouch under saline.  There was no bubbling or evidence of a leak.   There was no evidence of narrowing of the pouch and the gastric sleeve looked tubular.  The scope was then withdrawn.  The esophagus was unremarkable and the patient tolerated the endoscopy without difficulty.  Ovidio Kinavid Guled Gahan, MD, [facility] Pager: [phone] Office phone:  [phone]

## 2015-06-28 NOTE — Interval H&P Note (Signed)
History and Physical Interval Note:  06/28/2015 11:20 AM  Paul Le  has presented today for surgery, with the diagnosis of morbid obesity  The various methods of treatment have been discussed with the patient and family. After consideration of risks, benefits and other options for treatment, the patient has consented to  Procedure(s): LAPAROSCOPIC GASTRIC SLEEVE RESECTION WITH UPPER ENDOSCOPY (N/A) as a surgical intervention .  The patient's history has been reviewed, patient examined, no change in status, stable for surgery.  I have reviewed the patient's chart and labs.  Questions were answered to the patient's satisfaction.     [name] T

## 2015-06-28 NOTE — Anesthesia Preprocedure Evaluation (Signed)
Anesthesia Evaluation  Patient identified by MRN, date of birth, ID band Patient awake    Reviewed: Allergy & Precautions, NPO status , Patient's Chart, lab work & pertinent test results  Airway Mallampati: II   Neck ROM: full    Dental   Pulmonary asthma ,  breath sounds clear to auscultation        Cardiovascular hypertension, Rhythm:regular Rate:Normal     Neuro/Psych    GI/Hepatic   Endo/Other  diabetes, Type obesity  Renal/GU      Musculoskeletal   Abdominal   Peds  Hematology   Anesthesia Other Findings   Reproductive/Obstetrics                             Anesthesia Physical Anesthesia Plan  ASA: II  Anesthesia Plan: General   Post-op Pain Management:    Induction: Intravenous  Airway Management Planned: Oral ETT  Additional Equipment:   Intra-op Plan:   Post-operative Plan: Extubation in OR  Informed Consent: I have reviewed the patients History and Physical, chart, labs and discussed the procedure including the risks, benefits and alternatives for the proposed anesthesia with the patient or authorized representative who has indicated his/her understanding and acceptance.     Plan Discussed with: Anesthesiologist, Surgeon and CRNA  Anesthesia Plan Comments:         Anesthesia Quick Evaluation

## 2015-06-29 ENCOUNTER — Encounter (HOSPITAL_COMMUNITY): Payer: Self-pay | Admitting: General Surgery

## 2015-06-29 ENCOUNTER — Encounter: Payer: Self-pay | Admitting: *Deleted

## 2015-06-29 ENCOUNTER — Inpatient Hospital Stay (HOSPITAL_COMMUNITY): Payer: 59

## 2015-06-29 LAB — CBC WITH DIFFERENTIAL/PLATELET
Basophils Absolute: 0 10*3/uL (ref 0.0–0.1)
Basophils Relative: 0 % (ref 0–1)
EOS PCT: 0 % (ref 0–5)
Eosinophils Absolute: 0 10*3/uL (ref 0.0–0.7)
HEMATOCRIT: 46.1 % (ref 39.0–52.0)
Hemoglobin: 15 g/dL (ref 13.0–17.0)
Lymphocytes Relative: 7 % — ABNORMAL LOW (ref 12–46)
Lymphs Abs: 0.8 10*3/uL (ref 0.7–4.0)
MCH: 25.3 pg — ABNORMAL LOW (ref 26.0–34.0)
MCHC: 32.5 g/dL (ref 30.0–36.0)
MCV: 77.6 fL — ABNORMAL LOW (ref 78.0–100.0)
MONO ABS: 0.7 10*3/uL (ref 0.1–1.0)
MONOS PCT: 6 % (ref 3–12)
NEUTROS ABS: 9.4 10*3/uL — AB (ref 1.7–7.7)
Neutrophils Relative %: 87 % — ABNORMAL HIGH (ref 43–77)
PLATELETS: 174 10*3/uL (ref 150–400)
RBC: 5.94 MIL/uL — ABNORMAL HIGH (ref 4.22–5.81)
RDW: 16.1 % — AB (ref 11.5–15.5)
WBC: 10.9 10*3/uL — ABNORMAL HIGH (ref 4.0–10.5)

## 2015-06-29 LAB — GLUCOSE, CAPILLARY
GLUCOSE-CAPILLARY: 128 mg/dL — AB (ref 65–99)
GLUCOSE-CAPILLARY: 138 mg/dL — AB (ref 65–99)
GLUCOSE-CAPILLARY: 162 mg/dL — AB (ref 65–99)
GLUCOSE-CAPILLARY: 198 mg/dL — AB (ref 65–99)
Glucose-Capillary: 131 mg/dL — ABNORMAL HIGH (ref 65–99)
Glucose-Capillary: 145 mg/dL — ABNORMAL HIGH (ref 65–99)
Glucose-Capillary: 150 mg/dL — ABNORMAL HIGH (ref 65–99)

## 2015-06-29 LAB — HEMOGLOBIN AND HEMATOCRIT, BLOOD
HEMATOCRIT: 47.5 % (ref 39.0–52.0)
HEMOGLOBIN: 15.5 g/dL (ref 13.0–17.0)

## 2015-06-29 LAB — COMPREHENSIVE METABOLIC PANEL
ALT: 137 U/L — ABNORMAL HIGH (ref 17–63)
AST: 99 U/L — ABNORMAL HIGH (ref 15–41)
Albumin: 3.8 g/dL (ref 3.5–5.0)
Alkaline Phosphatase: 40 U/L (ref 38–126)
Anion gap: 8 (ref 5–15)
BUN: 15 mg/dL (ref 6–20)
CHLORIDE: 100 mmol/L — AB (ref 101–111)
CO2: 29 mmol/L (ref 22–32)
Calcium: 8.5 mg/dL — ABNORMAL LOW (ref 8.9–10.3)
Creatinine, Ser: 0.96 mg/dL (ref 0.61–1.24)
Glucose, Bld: 168 mg/dL — ABNORMAL HIGH (ref 65–99)
POTASSIUM: 4.1 mmol/L (ref 3.5–5.1)
Sodium: 137 mmol/L (ref 135–145)
TOTAL PROTEIN: 6.7 g/dL (ref 6.5–8.1)
Total Bilirubin: 0.6 mg/dL (ref 0.3–1.2)

## 2015-06-29 MED ORDER — IOHEXOL 300 MG/ML  SOLN
50.0000 mL | Freq: Once | INTRAMUSCULAR | Status: AC | PRN
  Administered 2015-06-29: 40 mL via ORAL

## 2015-06-29 MED ORDER — PNEUMOCOCCAL VAC POLYVALENT 25 MCG/0.5ML IJ INJ
0.5000 mL | INJECTION | INTRAMUSCULAR | Status: AC
  Administered 2015-06-30: 0.5 mL via INTRAMUSCULAR
  Filled 2015-06-29 (×2): qty 0.5

## 2015-06-29 NOTE — Plan of Care (Signed)
Problem: Food- and Nutrition-Related Knowledge Deficit (NB-1.1) Goal: Nutrition education Formal process to instruct or train a patient/client in a skill or to impart knowledge to help patients/clients voluntarily manage or modify food choices and eating behavior to maintain or improve health. Outcome: Completed/Met Date Met:  06/29/15 Nutrition Education Note  Received consult for diet education per DROP protocol.   Discussed 2 week post op diet with pt. Emphasized that liquids must be non carbonated, non caffeinated, and sugar free. Fluid goals discussed. Pt to follow up with outpatient bariatric RD for further diet progression after 2 weeks. Multivitamins and minerals also reviewed. Teach back method used, pt expressed understanding, expect good compliance.   Diet: First 2 Weeks  You will see the nutritionist about two (2) weeks after your surgery. The nutritionist will increase the types of foods you can eat if you are handling liquids well:  If you have severe vomiting or nausea and cannot handle clear liquids lasting longer than 1 day, call your surgeon  Protein Shake  Drink at least 2 ounces of shake 5-6 times per day  Each serving of protein shakes (usually 8 - 12 ounces) should have a minimum of:  15 grams of protein  And no more than 5 grams of carbohydrate  Goal for protein each day:  Men = 80 grams per day  Women = 60 grams per day  Protein powder may be added to fluids such as non-fat milk or Lactaid milk or Soy milk (limit to 35 grams added protein powder per serving)   Hydration  Slowly increase the amount of water and other clear liquids as tolerated (See Acceptable Fluids)  Slowly increase the amount of protein shake as tolerated  Sip fluids slowly and throughout the day  May use sugar substitutes in small amounts (no more than 6 - 8 packets per day; i.e. Splenda)   Fluid Goal  The first goal is to drink at least 8 ounces of protein shake/drink per day (or as directed  by the nutritionist); some examples of protein shakes are [employer], [employer], EAS Edge HP, and Unjury. See handout from pre-op Bariatric Education Class:  Slowly increase the amount of protein shake you drink as tolerated  You may find it easier to slowly sip shakes throughout the day  It is important to get your proteins in first  Your fluid goal is to drink 64 - 100 ounces of fluid daily  It may take a few weeks to build up to this  32 oz (or more) should be clear liquids  And  32 oz (or more) should be full liquids (see below for examples)  Liquids should not contain sugar, caffeine, or carbonation   Clear Liquids:  Water or Sugar-free flavored water (i.e. Fruit H2O, Propel)  Decaffeinated coffee or tea (sugar-free)  Crystal Lite, Wyler's Lite, Minute Maid Lite  Sugar-free Jell-O  Bouillon or broth  Sugar-free Popsicle: *Less than 20 calories each; Limit 1 per day   Full Liquids:  Protein Shakes/Drinks + 2 choices per day of other full liquids  Full liquids must be:  No More Than 12 grams of Carbs per serving  No More Than 3 grams of Fat per serving  Strained low-fat cream soup  Non-Fat milk  Fat-free Lactaid Milk  Sugar-free yogurt (Dannon Lite & Fit, Greek yogurt)     Clayton Bibles, MS, RD, LDN Pager: [phone] After Hours Pager: [phone]

## 2015-06-29 NOTE — Consult Note (Signed)
   [facility]   06/29/2015  Paul CamaraRolando Trupiano 31-Oct-1966 [id number]   Patient evaluated for Link to Wellness program for DM management for [employer] employees/dependents with [facility]. Spoke with both patient and wife at bedside to explain program and program requirements. Consents obtained and Link to [employer] packet completed. Will request for patient to be called for appointment and enrollment. Both patient and wife appreciative of visit and benefit of being in Link to [employer] program. Will make inpatient RNCM aware of visit.   Raiford NobleAtika Hall, MSN-Ed, RN,BSN [facility] Liaison [phone]

## 2015-06-29 NOTE — Progress Notes (Signed)
Patient ID: Paul Le, male   DOB: 11-04-1966, 49 y.o.   MRN: [id number] 1 Day Post-Op  Subjective: No C/O, mild pain, no nausea, ambulatory  Objective: Vital signs in last 24 hours: Temp:  [98 F (36.7 C)-98.3 F (36.8 C)] 98.1 F (36.7 C) (07/12 0221) Pulse Rate:  [80-102] 94 (07/12 0221) Resp:  [15-18] 16 (07/12 0221) BP: (122-144)/(66-86) 126/80 mmHg (07/12 0221) SpO2:  [91 %-97 %] 94 % (07/12 0221)    Intake/Output from previous day: 07/11 0701 - 07/12 0700 In: 2403.3 [I.V.:2353.3; IV Piggyback:50] Out: 2000 [Urine:2000] Intake/Output this shift:    General appearance: alert, cooperative and no distress GI: normal findings: soft, non-tender and mild distention Incision/Wound: Clean and dry  Lab Results:   Recent Labs  06/28/15 1913 06/29/15 0503  WBC  --  10.9*  HGB 16.0 15.0  HCT 48.3 46.1  PLT  --  174   BMET  Recent Labs  06/29/15 0503  NA 137  K 4.1  CL 100*  CO2 29  GLUCOSE 168*  BUN 15  CREATININE 0.96  CALCIUM 8.5*   CBG (last 3)   Recent Labs  06/28/15 2359 06/29/15 0418 06/29/15 0813  GLUCAP 198* 162* 138*      Studies/Results: No results found.  Anti-infectives: Anti-infectives    Start     Dose/Rate Route Frequency Ordered Stop   06/28/15 0851  cefOXitin (MEFOXIN) 2 g in dextrose 5 % 50 mL IVPB     2 g 100 mL/hr over 30 Minutes Intravenous On call to O.R. 06/28/15 0851 06/28/15 1145      Assessment/Plan: s/p Procedure(s): LAPAROSCOPIC GASTRIC SLEEVE RESECTION WITH UPPER ENDOSCOPY Doing well without apparent complication For gastrograffin swallow   LOS: 1 day    Shane Badeaux T 06/29/2015

## 2015-06-29 NOTE — Progress Notes (Signed)
Patient alert and oriented, Post op day 1.  Provided support and encouragement.  Encouraged pulmonary toilet, ambulation and small sips of liquids when swallow study returned satisfactorily.  All questions answered.  Will continue to monitor. 

## 2015-06-29 NOTE — Progress Notes (Signed)
Inpatient Diabetes Program Recommendations  AACE/ADA: New Consensus Statement on Inpatient Glycemic Control (2013)  Target Ranges:  Prepandial:   less than 140 mg/dL      Peak postprandial:   less than 180 mg/dL (1-2 hours)      Critically ill patients:  140 - 180 mg/dL   Reason for Visit: Diabetes Consult  Diabetes history: DM2 Outpatient Diabetes medications: metformin 1000 mg bid Current orders for Inpatient glycemic control: Novolog resistant Q4H    Results for Paul Le, Paul Le (MRN [id number]) as of 06/29/2015 17:51  Ref. Range 06/28/2015 23:59 06/29/2015 04:18 06/29/2015 08:13 06/29/2015 11:58 06/29/2015 16:42  Glucose-Capillary Latest Ref Range: 65-99 mg/dL 409198 (H) 811162 (H) 914138 (H) 145 (H) 150 (H)      49 year old male with progressive morbid obesity unresponsive to medical management of multiple comorbidities including insulin-dependent diabetes mellitus and hypertension, admitted for elected laparoscopic sleeve gastrectomy for initial surgical treatment for his morbid obesity. HgbA1C - 9.5%.  Recommendations: Do not restart metformin 1000 mg bid at discharge. Pt will need to check blood sugars 3-4 times/day and f/u with PCP who manages their diabetes.  Thank you. Ailene Ardshonda Daemian Gahm, RD, LDN, CDE Inpatient Diabetes Coordinator [phone]

## 2015-06-30 LAB — CBC WITH DIFFERENTIAL/PLATELET
BASOS PCT: 0 % (ref 0–1)
Basophils Absolute: 0 10*3/uL (ref 0.0–0.1)
EOS PCT: 1 % (ref 0–5)
Eosinophils Absolute: 0.1 10*3/uL (ref 0.0–0.7)
HCT: 49 % (ref 39.0–52.0)
HEMOGLOBIN: 15.5 g/dL (ref 13.0–17.0)
LYMPHS ABS: 1.5 10*3/uL (ref 0.7–4.0)
LYMPHS PCT: 15 % (ref 12–46)
MCH: 25.5 pg — ABNORMAL LOW (ref 26.0–34.0)
MCHC: 31.6 g/dL (ref 30.0–36.0)
MCV: 80.6 fL (ref 78.0–100.0)
Monocytes Absolute: 0.8 10*3/uL (ref 0.1–1.0)
Monocytes Relative: 8 % (ref 3–12)
Neutro Abs: 7.7 10*3/uL (ref 1.7–7.7)
Neutrophils Relative %: 76 % (ref 43–77)
Platelets: 188 10*3/uL (ref 150–400)
RBC: 6.08 MIL/uL — ABNORMAL HIGH (ref 4.22–5.81)
RDW: 16.6 % — AB (ref 11.5–15.5)
WBC: 10 10*3/uL (ref 4.0–10.5)

## 2015-06-30 LAB — GLUCOSE, CAPILLARY
Glucose-Capillary: 159 mg/dL — ABNORMAL HIGH (ref 65–99)
Glucose-Capillary: 132 mg/dL — ABNORMAL HIGH (ref 65–99)

## 2015-06-30 MED ORDER — GLUCOSE BLOOD VI STRP: ORAL_STRIP | Status: AC

## 2015-06-30 NOTE — Discharge Instructions (Signed)
Per bariatric nurse ° ° ° °GASTRIC BYPASS/SLEEVE ° Home Care Instructions ° ° These instructions are to help you care for yourself when you go home. ° °Call: If you have any problems. °• Call [phone] and ask for the surgeon on call °• If you need immediate assistance come to the ER at [facility]. Tell the ER staff you are a new post-op gastric bypass or gastric sleeve patient  °Signs and symptoms to report: • Severe  vomiting or nausea °o If you cannot handle clear liquids for longer than 1 day, call your surgeon °• Abdominal pain which does not get better after taking your pain medication °• Fever greater than 100.4°  F and chills °• Heart rate over 100 beats a minute °• Trouble breathing °• Chest pain °• Redness,  swelling, drainage, or foul odor at incision (surgical) sites °• If your incisions open or pull apart °• Swelling or pain in calf (lower leg) °• Diarrhea (Loose bowel movements that happen often), frequent watery, uncontrolled bowel movements °• Constipation, (no bowel movements for 3 days) if this happens: °o Take Milk of Magnesia, 2 tablespoons by mouth, 3 times a day for 2 days if needed °o Stop taking Milk of Magnesia once you have had a bowel movement °o Call your doctor if constipation continues °Or °o Take Miralax  (instead of Milk of Magnesia) following the label instructions °o Stop taking Miralax once you have had a bowel movement °o Call your doctor if constipation continues °• Anything you think is “abnormal for you” °  °Normal side effects after surgery: • Unable to sleep at night or unable to concentrate °• Irritability °• Being tearful (crying) or depressed ° °These are common complaints, possibly related to your anesthesia, stress of surgery, and change in lifestyle, that usually go away a few weeks after surgery. If these feelings continue, call your medical doctor.  °Wound Care: You may have surgical glue, steri-strips, or staples over your incisions after surgery °• Surgical  glue: Looks like clear film over your incisions and will wear off a little at a time °• Steri-strips: Adhesive strips of tape over your incisions. You may notice a yellowish color on skin under the steri-strips. This is used to make the steri-strips stick better. Do not pull the steri-strips off - let them fall off °• Staples: Staples may be removed before you leave the hospital °o If you go home with staples, call [facility] for an appointment with your surgeon’s nurse to have staples removed 10 days after surgery, [phone] °• Showering: You may shower two (2) days after your surgery unless your surgeon tells you differently °o Wash gently around incisions with warm soapy water, rinse well, and gently pat dry °o If you have a drain (tube from your incision), you may need someone to hold this while you shower °o No tub baths until staples are removed and incisions are healed °  °Medications: • Medications should be liquid or crushed if larger than the size of a dime °• Extended release pills (medication that releases a little bit at a time through the  day) should not be crushed °• Depending on the size and number of medications you take, you may need to space (take a few throughout the day)/change the time you take your medications so that you do not over-fill your pouch (smaller stomach) °• Make sure you follow-up with you primary care physician to make medication changes needed during rapid weight loss and   life -style changes °• If you have diabetes, follow up with your doctor that orders your diabetes medication(s) within one week after surgery and check your blood sugar regularly ° °• Do not drive while taking narcotics (pain medications) ° °• Do not take acetaminophen (Tylenol) and Roxicet or Lortab Elixir at the same time since these pain medications contain acetaminophen °  °Diet:  °First 2 Weeks You will see the nutritionist about two (2) weeks after your surgery. The nutritionist will  increase the types of foods you can eat if you are handling liquids well: °• If you have severe vomiting or nausea and cannot handle clear liquids lasting longer than 1 day call your surgeon °Protein Shake °• Drink at least 2 ounces of shake 5-6 times per day °• Each serving of protein shakes (usually 8-12 ounces) should have a minimum of: °o 15 grams of protein °o And no more than 5 grams of carbohydrate °• Goal for protein each day: °o Men = 80 grams per day °o Women = 60 grams per day °  ° • Protein powder may be added to fluids such as non-fat milk or Lactaid milk or Soy milk (limit to 35 grams added protein powder per serving) ° °Hydration °• Slowly increase the amount of water and other clear liquids as tolerated (See Acceptable Fluids) °• Slowly increase the amount of protein shake as tolerated °• Sip fluids slowly and throughout the day °• May use sugar substitutes in small amounts (no more than 6-8 packets per day; i.e. Splenda) ° °Fluid Goal °• The first goal is to drink at least 8 ounces of protein shake/drink per day (or as directed by the nutritionist); some examples of protein shakes are Syntrax Nectar, Adkins Advantage, EAS Edge HP, and Unjury. - See handout from pre-op Bariatric Education Class: °o Slowly increase the amount of protein shake you drink as tolerated °o You may find it easier to slowly sip shakes throughout the day °o It is important to get your proteins in first °• Your fluid goal is to drink 64-100 ounces of fluid daily °o It may take a few weeks to build up to this  °• 32 oz. (or more) should be clear liquids °And °• 32 oz. (or more) should be full liquids (see below for examples) °• Liquids should not contain sugar, caffeine, or carbonation ° °Clear Liquids: °• Water of Sugar-free flavored water (i.e. Fruit H²O, Propel) °• Decaffeinated coffee or tea (sugar-free) °• Crystal lite, Wyler’s Lite, Minute Maid Lite °• Sugar-free Jell-O °• Bouillon or broth °• Sugar-free Popsicle:    -  Less than 20 calories each; Limit 1 per day ° °Full Liquids: °                  Protein Shakes/Drinks + 2 choices per day of other full liquids °• Full liquids must be: °o No More Than 12 grams of Carbs per serving °o No More Than 3 grams of Fat per serving °• Strained low-fat cream soup °• Non-Fat milk °• Fat-free Lactaid Milk °• Sugar-free yogurt (Dannon Lite & Fit, Greek yogurt) ° °  °Vitamins and Minerals • Start 1 day after surgery unless otherwise directed by your surgeon °• 2 Chewable Multivitamin / Multimineral Supplement with iron (i.e. Centrum for Adults) °• Vitamin B-12, 350-500 micrograms sub-lingual (place tablet under the tongue) each day °• Chewable Calcium Citrate with Vitamin D-3 °(Example: 3 Chewable Calcium  Plus 600 with Vitamin D-3) °o Take 500 mg three (3)   times a day for a total of 1500 mg each day °o Do not take all 3 doses of calcium at one time as it may cause constipation, and you can only absorb 500 mg at a time °o Do not mix multivitamins containing iron with calcium supplements;  take 2 hours apart °o Do not substitute Tums (calcium carbonate) for your calcium °• Menstruating women and those at risk for anemia ( a blood disease that causes weakness) may need extra iron °o Talk to your doctor to see if you need more iron °• If you need extra iron: Total daily Iron recommendation (including Vitamins) is 50 to 100 mg Iron/day °• Do not stop taking or change any vitamins or minerals until you talk to your nutritionist or surgeon °• Your nutritionist and/or surgeon must approve all vitamin and mineral supplements °  °Activity and Exercise: It is important to continue walking at home. Limit your physical activity as instructed by your doctor. During this time, use these guidelines: °• Do not lift anything greater than ten  (10) pounds for at least two (2) weeks °• Do not go back to work or drive until your surgeon says you can °• You may have sex when you feel comfortable °o It is VERY  important for male patients to use a reliable birth control method; fertility often increase after surgery °o Do not get pregnant for at least 18 months °• Start exercising as soon as your doctor tells you that you can °o Make sure your doctor approves any physical activity °• Start with a simple walking program °• Walk 5-15 minutes each day, 7 days per week °• Slowly increase until you are walking 30-45 minutes per day °• Consider joining our BELT program. [phone] or email [email or web address] °  °Special Instructions Things to remember: °• Free counseling is available for you and your family through collaboration between [facility] and [employer]. Please call [phone] and leave a message °• Use your CPAP when sleeping if this applies to you °• Consider buying a medical alert bracelet that says you had lap-band surgery °  °  You will likely have your first fill (fluid added to your band) 6 - 8 weeks after surgery °• [facility] has a free Bariatric Surgery Support Group that meets monthly, the 3rd Thursday, 6pm. [facility] [facility] Classrooms. You can see classes online at [email or web address] °• It is very important to keep all follow up appointments with your surgeon, nutritionist, primary care physician, and behavioral health practitioner °o After the first year, please follow up with your bariatric surgeon and nutritionist at least once a year in order to maintain best weight loss results °      °             [facility]:  [phone] ° °             [facility]: [phone] ° °             Bariatric Nurse Coordinator: [phone]  °Gastric Bypass/Sleeve Home Care Instructions  Rev. 01/2013    ° °                                                    Reviewed and Endorsed °                                                     by [facility] Patient Education Committee, Jan, 2014 ° ° ° ° ° ° ° ° ° °

## 2015-06-30 NOTE — Progress Notes (Signed)
Patient ID: Paul CamaraRolando Le, male   DOB: 1966-07-06, 49 y.o.   MRN: [id number] 2 Days Post-Op  Subjective: No complaints this morning. Some expected soreness. Tolerating water by mouth.  Objective: Vital signs in last 24 hours: Temp:  [97.9 F (36.6 C)-98.7 F (37.1 C)] 98.2 F (36.8 C) (07/13 0515) Pulse Rate:  [79-91] 91 (07/13 0515) Resp:  [16-18] 18 (07/13 0515) BP: (117-148)/(78-99) 124/90 mmHg (07/13 0515) SpO2:  [91 %-98 %] 91 % (07/13 0515)    Intake/Output from previous day: 07/12 0701 - 07/13 0700 In: 1088.3 [P.O.:180; I.V.:908.3] Out: 1000 [Urine:1000] Intake/Output this shift:    General appearance: alert, cooperative and no distress GI: normal findings: soft, non-tender Incision/Wound: clean and dry with some mild ecchymosis at the left mid abdominal incision  Lab Results:   Recent Labs  06/29/15 0503 06/29/15 1609 06/30/15 0553  WBC 10.9*  --  10.0  HGB 15.0 15.5 15.5  HCT 46.1 47.5 49.0  PLT 174  --  188   BMET  Recent Labs  06/29/15 0503  NA 137  K 4.1  CL 100*  CO2 29  GLUCOSE 168*  BUN 15  CREATININE 0.96  CALCIUM 8.5*   CBG (last 3)   Recent Labs  06/29/15 2336 06/30/15 0329 06/30/15 0741  GLUCAP 131* 159* 132*      Studies/Results: Dg Ugi W/water Sol Cm  06/29/2015   CLINICAL DATA:  49 year old male status post sleeve gastrectomy.  EXAM: WATER SOLUBLE UPPER GI SERIES  TECHNIQUE: Single-column upper GI series was performed using water soluble contrast.  CONTRAST:  40mL OMNIPAQUE IOHEXOL 300 MG/ML  SOLN  COMPARISON:  04/12/2015.  FLUOROSCOPY TIME:  If the device does not provide the exposure index:  Fluoroscopy Time (in minutes and seconds):  3 minutes and 22 seconds  Number of Acquired Images:  18  FINDINGS: Initial KUB demonstrated surgical clips in the epigastric region. Subsequently, following ingestion of water soluble oral contrast, the typical post sleeve gastrectomy appearance of the stomach was observed. Contrast readily  traversed the pylorus into the proximal small bowel. No extravasation of contrast was noted.  IMPRESSION: 1. Typical postoperative appearance following sleeve gastrectomy, as above, without acute complicating features.   Electronically Signed   By: Trudie Reedaniel  Entrikin M.D.   On: 06/29/2015 11:26    Anti-infectives: Anti-infectives    Start     Dose/Rate Route Frequency Ordered Stop   06/28/15 0851  cefOXitin (MEFOXIN) 2 g in dextrose 5 % 50 mL IVPB     2 g 100 mL/hr over 30 Minutes Intravenous On call to O.R. 06/28/15 0851 06/28/15 1145      Assessment/Plan: s/p Procedure(s): LAPAROSCOPIC GASTRIC SLEEVE RESECTION WITH UPPER ENDOSCOPY Doing well without apparent complication. Advanced to protein shakes and planned discharge later today if tolerated   LOS: 2 days    Ryelan Kazee T 06/30/2015

## 2015-06-30 NOTE — Progress Notes (Signed)
Patient alert and oriented, pain is controlled. Patient is tolerating fluids,  advanced to protein shake today, patient tolerated well.  Reviewed Gastric sleeve discharge instructions with patient and patient is able to articulate understanding.  Provided information on BELT program, Support Group and [facility] outpatient pharmacy. All questions answered, will continue to monitor.  

## 2015-06-30 NOTE — Progress Notes (Signed)
Patient is alert and oriented, vital signs are stable, incisions are within normal limits, patient is tolerating her bariatric shakes, discharge instructions reviewed with patient and spouse, oral pain medication effective, patient to follow up with [facility] BreedBracey, Jannell Franta N RN 12:04 PM  06-30-2015

## 2015-06-30 NOTE — Discharge Summary (Signed)
   Patient ID: Paul Le [id number] 49 y.o. June 26, 1966  06/28/2015  Discharge date and time: 06/30/2015   Admitting Physician: Glenna FellowsHOXWORTH,Torrell Krutz T  Discharge Physician: Glenna FellowsHOXWORTH,Canyon Willow T  Admission Diagnoses: morbid obesity  Discharge Diagnoses: Same  Operations: Procedure(s): LAPAROSCOPIC GASTRIC SLEEVE RESECTION WITH UPPER ENDOSCOPY  Admission Condition: good  Discharged Condition: good  Indication for Admission: Patient has progressive morbid obesity unresponsive to multiple attempts at medical management and comorbidities of adult-onset diabetes mellitus, hypertension and elevated cholesterol. After extensive preoperative discussion and workup detailed elsewhere he is admitted for laparoscopic sleeve gastrectomy for surgical treatment for his morbid obesity  Hospital Course: Patient underwent an uneventful laparoscopic sleeve gastrectomy. His postoperative course was unremarkable. Gastrografin swallow on postop day 1 showed no leak or obstruction. He tolerated water. On the second postoperative day he was advanced to protein shakes. Vital signs are normal. CBC is normal. Abdomen is nontender and wounds healing well.   Disposition: Home  Patient Instructions:    Medication List    TAKE these medications        amLODipine 5 MG tablet  Commonly known as:  NORVASC  Take 5 mg by mouth daily.     aspirin 81 MG tablet  Take 81 mg by mouth daily.     atorvastatin 10 MG tablet  Commonly known as:  LIPITOR  Take 10 mg by mouth at bedtime.     B12 LIQUID HEALTH BOOSTER PO  Take 1 mL by mouth daily.     CALCIUM + D PO  Take 1 tablet by mouth daily.     Chromium-Cinnamon [id number] MCG-MG Caps  Take 1 capsule by mouth 2 (two) times daily.     diphenhydramine-acetaminophen 25-500 MG Tabs  Commonly known as:  TYLENOL PM  Take 2 tablets by mouth at bedtime as needed (sleep).     hydrochlorothiazide 25 MG tablet  Commonly known as:  HYDRODIURIL  Take 25 mg by  mouth daily.     Krill Oil 300 MG Caps  Take 1 capsule by mouth daily.     metFORMIN 1000 MG tablet  Commonly known as:  GLUCOPHAGE  Take 1,000 mg by mouth 2 (two) times daily with a meal.     metoprolol succinate 50 MG 24 hr tablet  Commonly known as:  TOPROL-XL  Take 50 mg by mouth at bedtime. Take with or immediately following a meal.     multivitamin with minerals Tabs tablet  Take 1 tablet by mouth daily.        Activity: activity as tolerated Diet: bariatric protein shakes Wound Care: none needed  Follow-up:  With Dr. Johna SheriffHoxworth in 3 weeks.  Signed: Mariella SaaBenjamin T Artur Winningham MD, FACS  06/30/2015, 8:16 AM

## 2015-07-01 ENCOUNTER — Telehealth (HOSPITAL_COMMUNITY): Payer: Self-pay

## 2015-07-01 NOTE — Telephone Encounter (Signed)
Made discharge phone call to patient per DROP protocol. Asking the following questions.    1. Do you have someone to care for you now that you are home?  yes 2. Are you having pain now that is not relieved by your pain medication?  no 3. Are you able to drink the recommended daily amount of fluids (48 ounces minimum/day) and protein (60-80 grams/day) as prescribed by the dietitian or nutritional counselor? yes  4. Are you taking the vitamins and minerals as prescribed?  yes 5. Do you have the "on call" number to contact your surgeon if you have a problem or question?  Yes 6. Are your incisions free of redness, swelling or drainage? (If steri strips, address that these can fall off, shower as tolerated) yes  7. Have your bowels moved since your surgery?  If not, are you passing gas?  yes 8. Are you up and walking 3-4 times per day?      1. Do you have an appointment made to see your surgeon in the next month?  yes 2. Were you provided your discharge medications before your surgery or before you were discharged from the hospital and are you taking them without problem?  yes 3. Were you provided phone numbers to the clinic/surgeon's office?  yes 4. Did you watch the patient education video module in the (clinic, surgeon's office, etc.) before your surgery? no 5. Do you have a discharge checklist that was provided to you in the hospital to reference with instructions on how to take care of yourself after surgery?  yes 6. Did you see a dietitian or nutritional counselor while you were in the hospital?  yes 7. Do you have an appointment to see a dietitian or nutritional counselor in the next month?  yes

## 2015-07-09 ENCOUNTER — Other Ambulatory Visit: Payer: Self-pay | Admitting: *Deleted

## 2015-07-09 NOTE — Patient Outreach (Signed)
Paul Le did not keep his initial intake office visit to enroll in the Link To Wellness program at 9:00 am this morning. This RNCM received a call from Skip Estimable Bariatric Nurse Coordinator stating Paul Le called her confused about why he received a reminder call about the appointment and requested that this RNCM call him to clarify. Paul Le advised this RNCM that Paul Le had bariatric weight loss surgery and will likely no longer require DM medications. This RNCM called Paul Le and explained the Link To Wellness program, benefits and requirements. He denied having difficulty paying for his DM medication of Metformin or DM testing supplies. After much discussion, Paul Le opted not to proceed with enrolling in the Link To Wellness program but is aware that should he change his mind, he will call the [facility] CM office and enroll as long as he remains on the [facility] [employer] health plan.  Paul Richard RN,CCM,CDE [facility] Care Management Coordinator Link To Wellness Office Phone [phone] Office Fax [phone]

## 2015-07-13 ENCOUNTER — Encounter: Payer: 59 | Attending: General Surgery

## 2015-07-13 DIAGNOSIS — Z01818 Encounter for other preprocedural examination: Secondary | ICD-10-CM | POA: Diagnosis present

## 2015-07-13 DIAGNOSIS — Z713 Dietary counseling and surveillance: Secondary | ICD-10-CM | POA: Insufficient documentation

## 2015-07-13 DIAGNOSIS — Z6841 Body Mass Index (BMI) 40.0 and over, adult: Secondary | ICD-10-CM | POA: Insufficient documentation

## 2015-07-13 NOTE — Progress Notes (Signed)
  Bariatric Class:  Appt start time: 1530 end time:  1630.  2 Week Post-Operative Nutrition Class  Patient was seen on 07/13/2015 for Post-[facility]ative [facility]   Surgery date: 06/28/2015 Surgery type: Gastric sleeve Start weight at [facility]: 259.5 lbs on 04/13/2015 Weight today: 233.0 lbs  Weight change: 27.5 lbs  TANITA  BODY COMP RESULTS  05/31/15 07/13/15   BMI (kg/m^2) 42 37.6   Fat Mass (lbs) 83.5 75.5   Fat Free Mass (lbs) 177 157.5   Total Body Water (lbs) 129.5 115.5   The following the learning objectives were met by the patient during this course:  Identifies Phase 3A (Soft, High Proteins) Dietary Goals and will begin from 2 weeks post-operatively to 2 months post-operatively  Identifies appropriate sources of fluids and proteins   States protein recommendations and appropriate sources post-operatively  Identifies the need for appropriate texture modifications, mastication, and bite sizes when consuming solids  Identifies appropriate multivitamin and calcium sources post-operatively  Describes the need for physical activity post-operatively and will follow MD recommendations  States when to call healthcare provider regarding medication questions or post-operative complications  Handouts given during class include:  Phase 3A: Soft, High Protein Diet Handout  Follow-Up Plan: Patient will follow-up at [facility] in 6 weeks for 2 month post-op nutrition visit for diet advancement per MD.

## 2015-07-24 ENCOUNTER — Ambulatory Visit: Payer: 59

## 2015-08-05 ENCOUNTER — Encounter: Payer: 59 | Attending: General Surgery | Admitting: Dietician

## 2015-08-05 ENCOUNTER — Encounter: Payer: Self-pay | Admitting: Dietician

## 2015-08-05 DIAGNOSIS — Z713 Dietary counseling and surveillance: Secondary | ICD-10-CM | POA: Diagnosis not present

## 2015-08-05 DIAGNOSIS — Z01818 Encounter for other preprocedural examination: Secondary | ICD-10-CM | POA: Insufficient documentation

## 2015-08-05 DIAGNOSIS — Z6841 Body Mass Index (BMI) 40.0 and over, adult: Secondary | ICD-10-CM | POA: Insufficient documentation

## 2015-08-05 NOTE — Progress Notes (Signed)
  Follow-up visit:  6 Weeks Post-Operative Sleeve Gastrectomy Surgery  Medical Nutrition Therapy:  Appt start time: 205 end time:  230  Primary concerns today: Post-operative Bariatric Surgery Nutrition Management.  Jayshun returns having lost another 15 lbs of fat. He states he is feeling great. Able to tolerate about 4 oz of meat.  Surgery date: 06/28/2015 Surgery type: Gastric sleeve Start weight at [facility]: 259.5 lbs on 04/13/2015 Weight today: 221.5 Weight change: 12 lbs Total weight loss: 38 lbs  TANITA  BODY COMP RESULTS  05/31/15 07/13/15 08/05/15   BMI (kg/m^2) 42 37.6 35.8   Fat Mass (lbs) 83.5 75.5 60   Fat Free Mass (lbs) 177 157.5 161.5   Total Body Water (lbs) 129.5 115.5 118    Preferred Learning Style:   No preference indicated   Learning Readiness:   Ready  24-hr recall: B(6:30 AM): 1/2 protein shake (15g) B (8:30AM): 1 whole egg or 2 egg whites (7g) L (PM): tortilla with 1 slice cheese and 1 slice [place] OR 2 oz baked salmon (14g) Snk (PM): 1/2 premier protein shake (15g)  D (PM): 4 oz chicken or seafood (35 g) Snk (PM): 4 oz protein shake OR PB2 (5-10g)  Fluid intake: water, water with Propel, protein shake (64 oz per patient) Estimated total protein intake: 96 grams  Medications: no longer taking Metformin or Amlodipine Supplementation: taking  CBG monitoring: 2x a day Average CBG per patient: 83-110 mg/dL Last patient reported Z6X: unknown  Using straws: no Drinking while eating: no Hair loss: yes Carbonated beverages: no N/V/D/C: some nausea but no vomiting; constipation Dumping syndrome: no  Recent physical activity:  Treadmill for 45 minutes daily  Progress Towards Goal(s):  In progress.  Handouts given during visit include:  Phase 3B lean protein + non starchy vegetables   Nutritional Diagnosis:  [place]-3.3 Overweight/obesity related to past poor dietary habits and physical inactivity as evidenced by patient w/ recent sleeve gastrectomy  surgery following dietary guidelines for continued weight loss.     Intervention:  Nutrition counseling provided.  Teaching Method Utilized:  Visual Auditory Hands on  Barriers to learning/adherence to lifestyle change: none  Demonstrated degree of understanding via:  Teach Back   Monitoring/Evaluation:  Dietary intake, exercise, and body weight. Follow up prn.

## 2015-08-05 NOTE — Patient Instructions (Addendum)
Goals:  Follow Phase 3B: High Protein + Non-Starchy Vegetables  Eat 3-6 small meals/snacks, every 3-5 hrs  Increase lean protein foods to meet 80g goal  Increase fluid intake to 64oz +  Avoid drinking 15 minutes before, during and 30 minutes after eating  Aim for >30 min of physical activity daily  Surgery date: 06/28/2015 Surgery type: Gastric sleeve Start weight at [facility]: 259.5 lbs on 04/13/2015 Weight today: 221.5 Weight change: 12 lbs Total weight loss: 38 lbs  TANITA  BODY COMP RESULTS  05/31/15 07/13/15 08/05/15   BMI (kg/m^2) 42 37.6 35.8   Fat Mass (lbs) 83.5 75.5 60   Fat Free Mass (lbs) 177 157.5 161.5   Total Body Water (lbs) 129.5 115.5 118

## 2015-08-24 ENCOUNTER — Ambulatory Visit: Payer: 59 | Admitting: Dietician

## 2016-01-28 MED FILL — ATORVASTATIN 10 MG TABLET: 10 | 90 days supply | Qty: 90 | Fill #1

## 2016-05-26 DIAGNOSIS — J45909 Unspecified asthma, uncomplicated: Secondary | ICD-10-CM | POA: Diagnosis not present

## 2016-05-26 DIAGNOSIS — H524 Presbyopia: Secondary | ICD-10-CM | POA: Diagnosis not present

## 2016-05-26 DIAGNOSIS — I10 Essential (primary) hypertension: Secondary | ICD-10-CM | POA: Diagnosis not present

## 2016-05-26 DIAGNOSIS — E119 Type 2 diabetes mellitus without complications: Secondary | ICD-10-CM | POA: Diagnosis not present

## 2016-05-26 DIAGNOSIS — E785 Hyperlipidemia, unspecified: Secondary | ICD-10-CM | POA: Diagnosis not present

## 2016-06-06 MED FILL — METOPROLOL SUCC ER 50 MG TA: 50 | 90 days supply | Qty: 90 | Fill #0

## 2016-06-06 MED FILL — ATORVASTATIN 10 MG TABLET: 10 | 90 days supply | Qty: 90 | Fill #0

## 2016-06-06 MED FILL — ALBUTEROL 0.083 MG/ML SOLN: (2.5 MG/3ML | 7 days supply | Qty: 90 | Fill #0

## 2016-07-24 IMAGING — CR DG UGI W/ KUB
2 series · 2 of 2 positions shown · non-contrast
Comparison: None.

CLINICAL DATA: Morbid obesity. Pre-op evaluation for bariatric
surgery. Diabetes and hypertension.

EXAM:
UPPER GI SERIES WITH KUB
TECHNIQUE: After obtaining a scout radiograph a routine upper GI series was
performed using thin barium.
FLUOROSCOPY TIME:  Fluoroscopy Time (in minutes and seconds): 3
minutes 6 seconds
Number of Acquired Images:  14

[abdomen supine (1 of 2)]
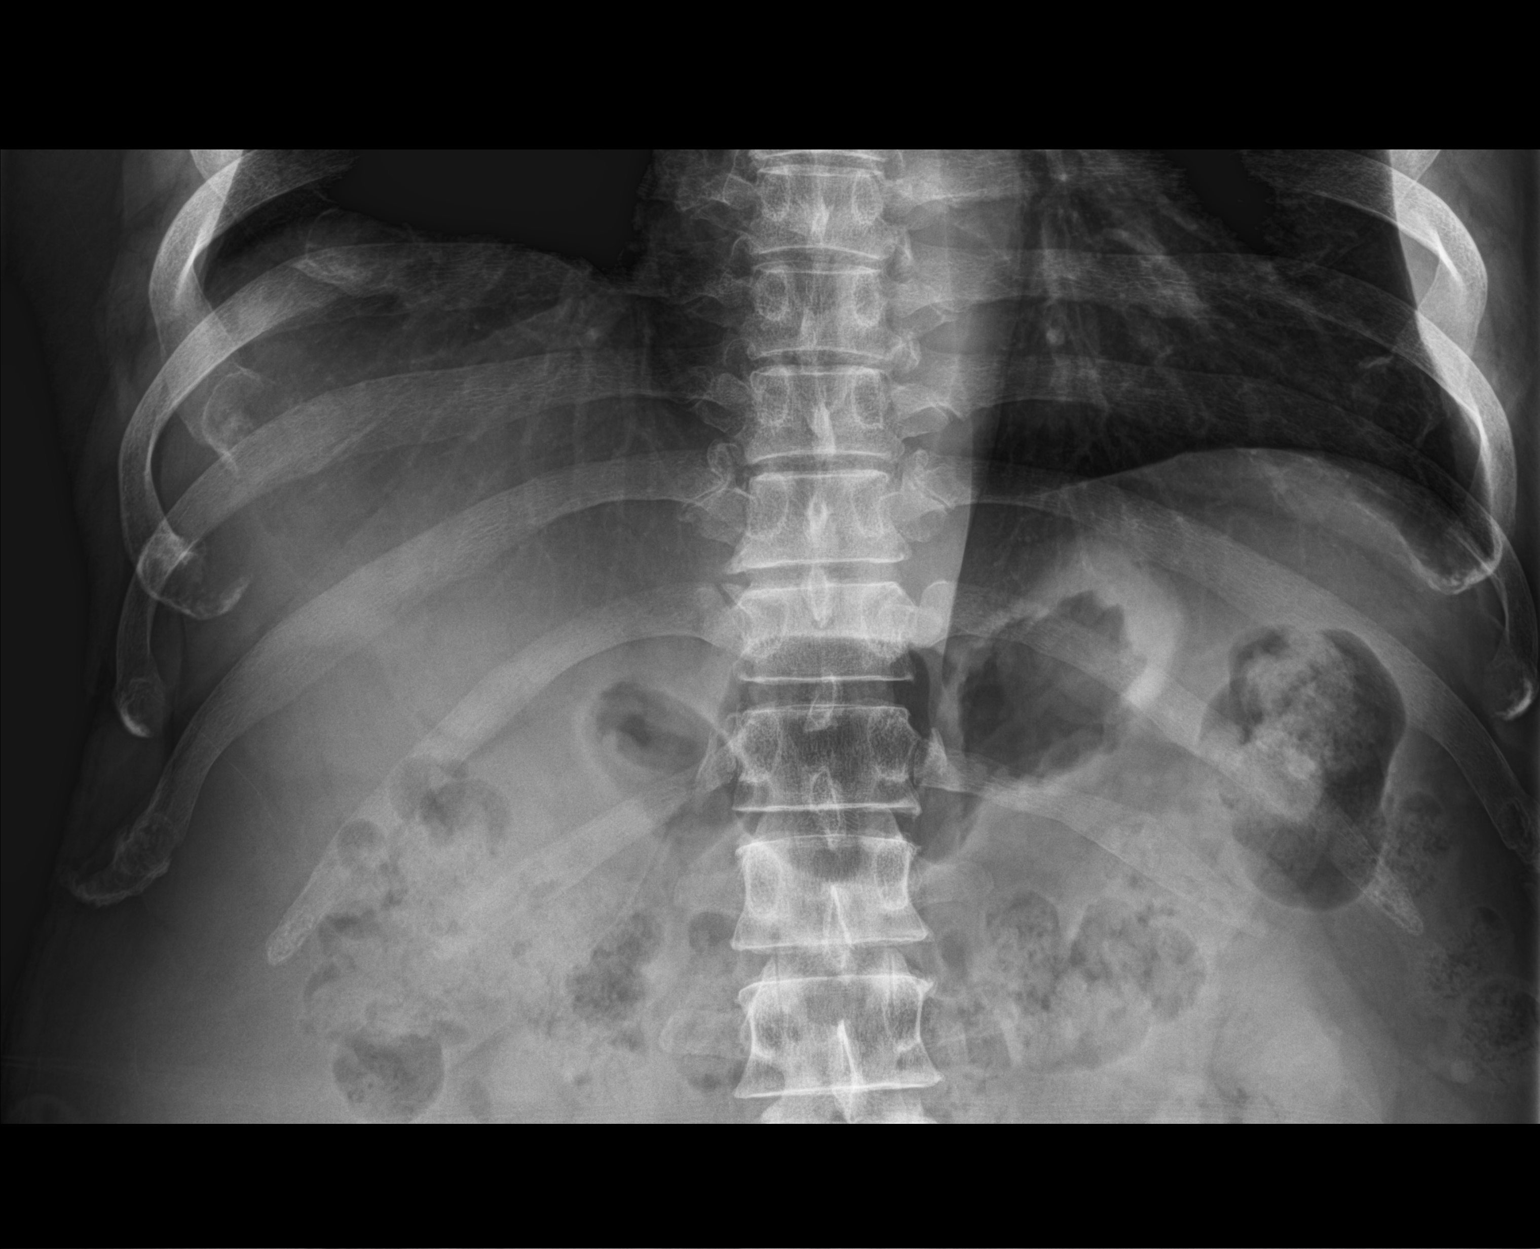

[abdomen supine (2 of 2)]
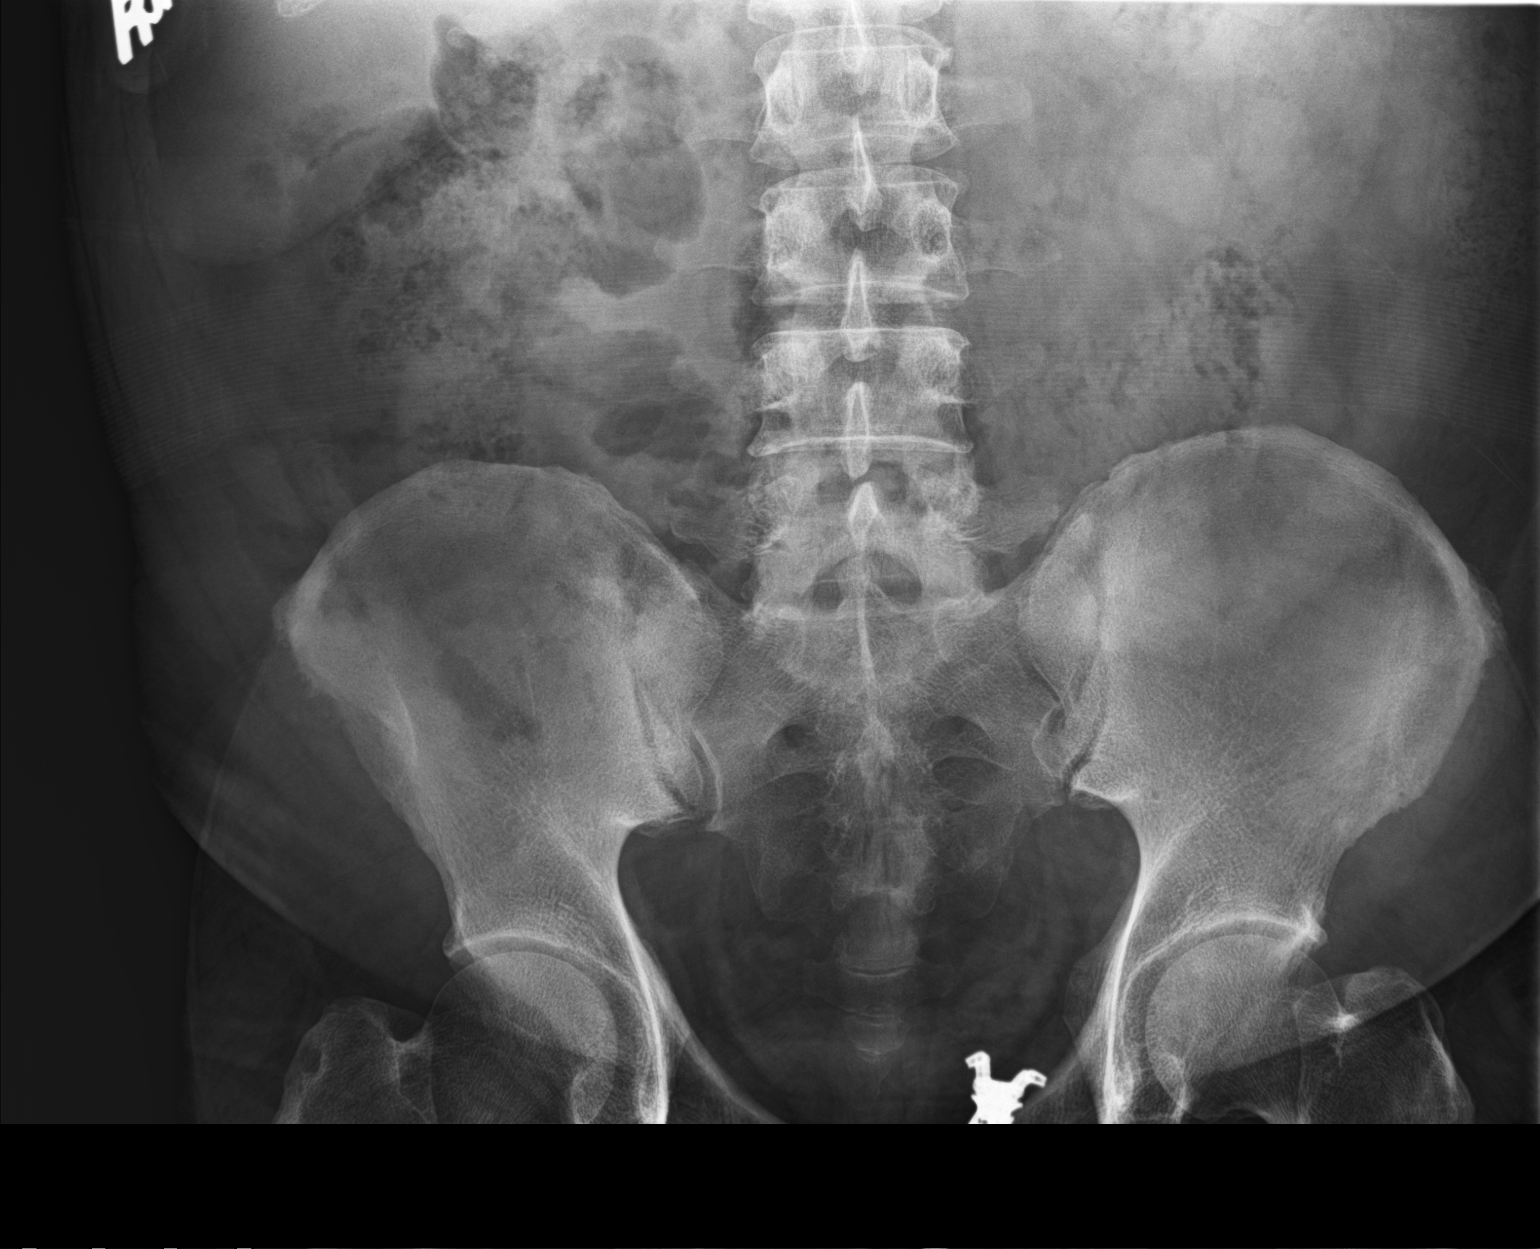

[2 of 2 positions shown; findings below may reference images not displayed]

FINDINGS: Scout radiograph:  Unremarkable bowel gas pattern.

Esophagus: No evidence of esophageal mass or stricture. Motility is
within normal limits. No gastroesophageal reflux observed.

Stomach: No hiatal hernia visualized. No evidence of gastric mass or
ulcer.

Duodenum: No ulcer or other significant abnormality seen involving
duodenal bulb or sweep.

Other:  None.
IMPRESSION: Negative upper GI series.

## 2016-07-24 IMAGING — CR DG CHEST 2V
2 series · 2 of 2 positions shown · non-contrast
Comparison: None.

CLINICAL DATA: History of asthma and diabetes .

EXAM:
CHEST  2 VIEW

[chest pa]
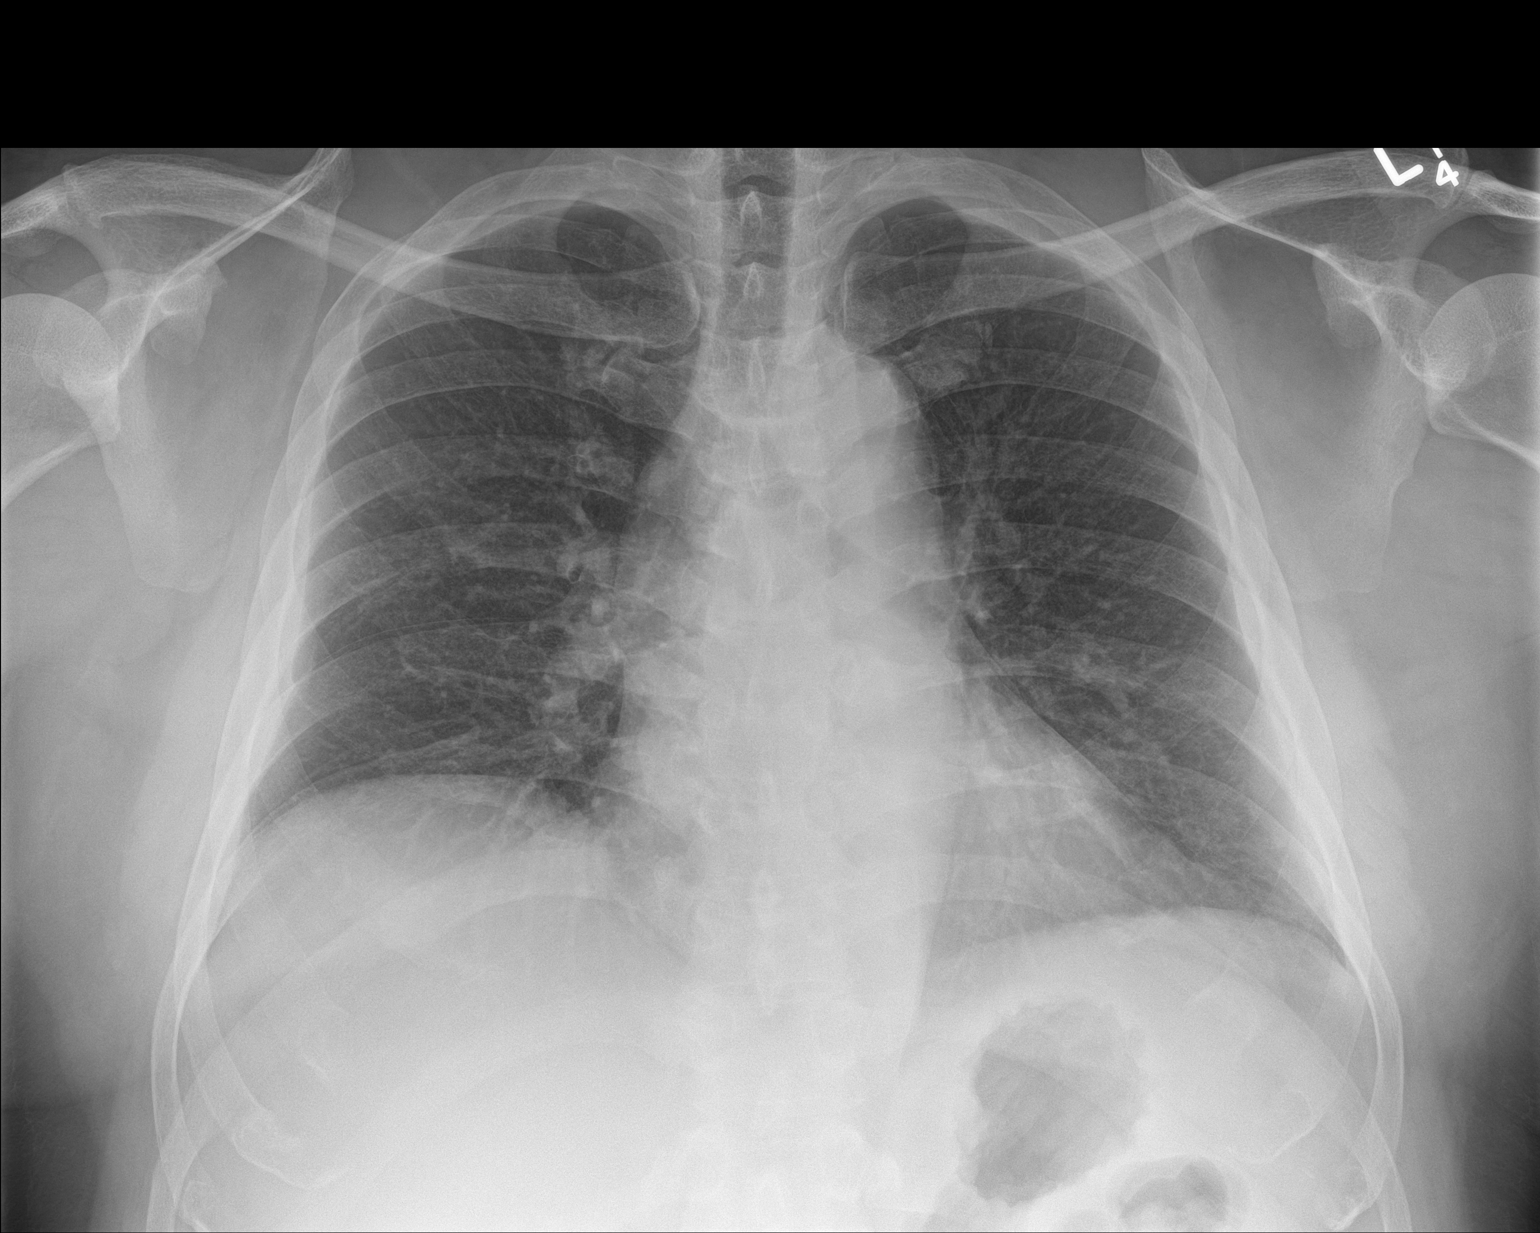

[chest lat]
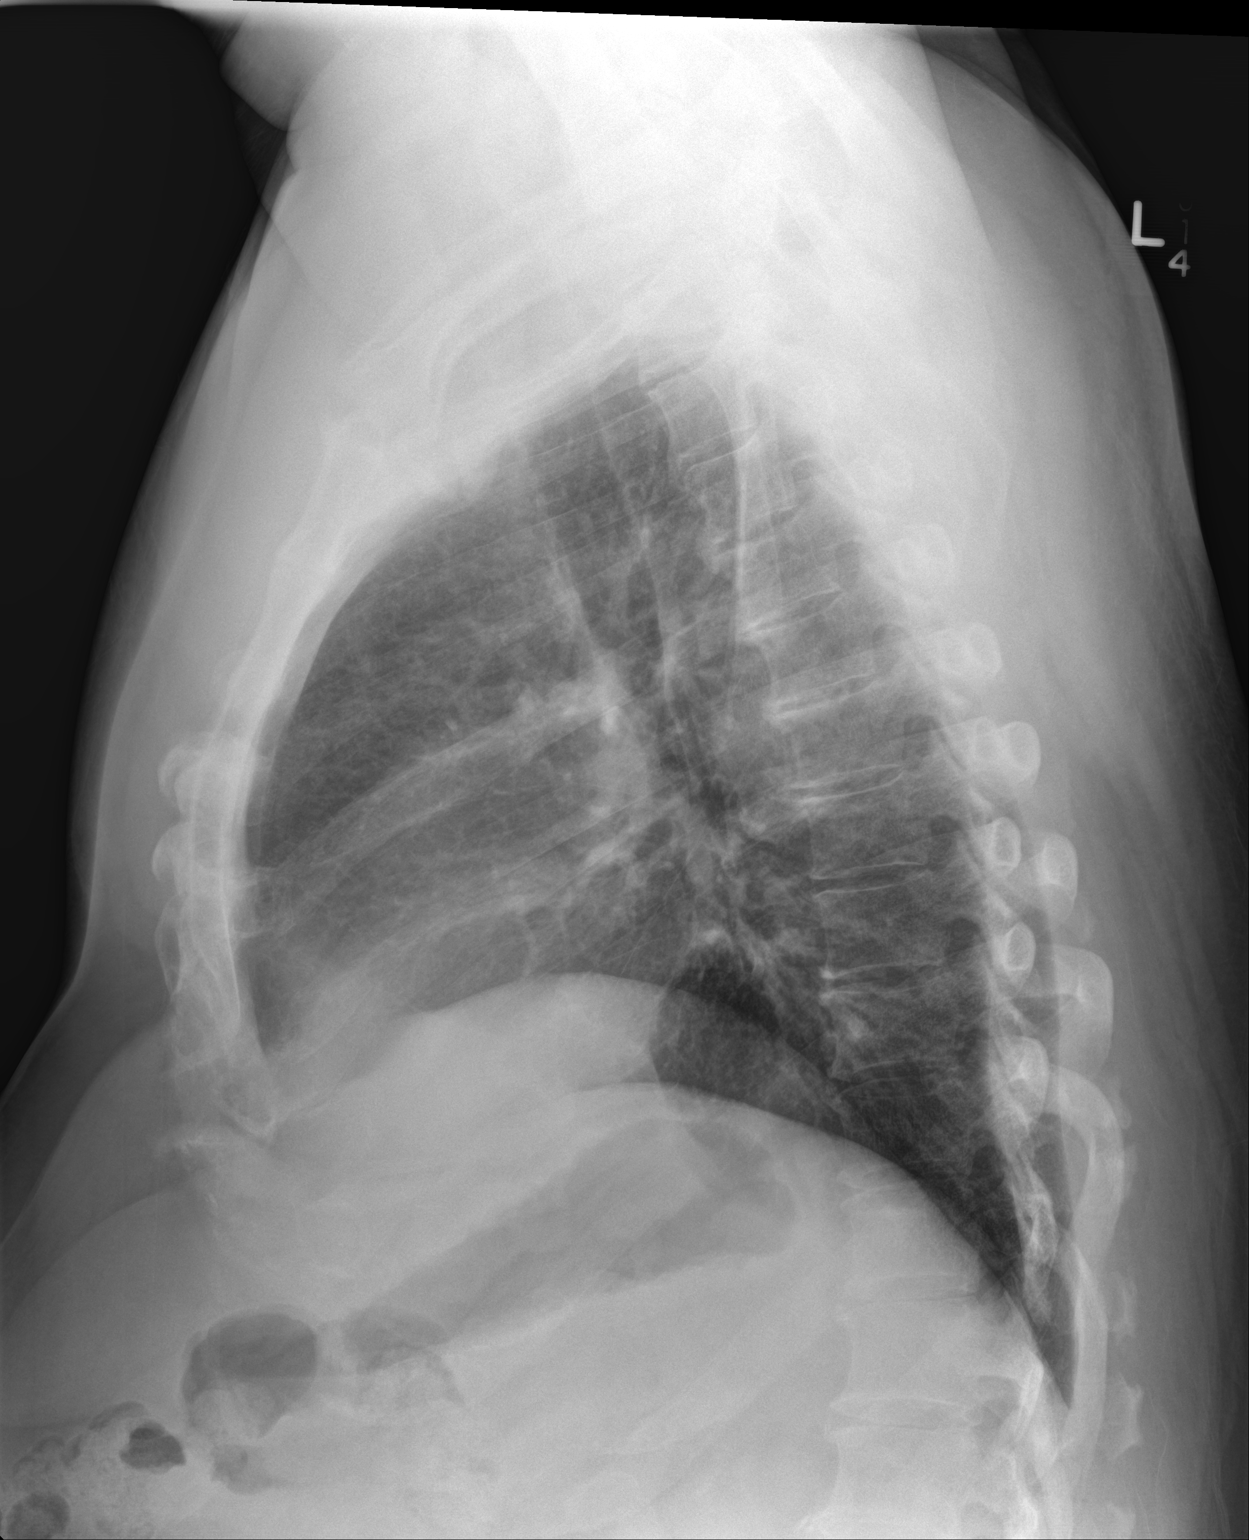

[2 of 2 positions shown; findings below may reference images not displayed]

FINDINGS: Mediastinum and hilar structures are normal. Low lung volumes with
mild basilar atelectasis. No pleural effusion or pneumothorax. No
acute bony abnormality.
IMPRESSION: Low lung volumes with mild basilar atelectasis.

## 2016-09-21 MED FILL — ATORVASTATIN 10 MG TABLET: 10 | 90 days supply | Qty: 90 | Fill #1

## 2016-09-21 MED FILL — METOPROLOL SUCC ER 50 MG TA: 50 | 90 days supply | Qty: 90 | Fill #1

## 2016-10-10 IMAGING — RF DG UGI W/ GASTROGRAFIN
14 of 17 series · 14 of 17 positions shown · IV contrast (omnipaque)
Comparison: 04/12/2015.

CLINICAL DATA: 48-year-old male status post sleeve gastrectomy.

EXAM:
WATER SOLUBLE UPPER GI SERIES
TECHNIQUE: Single-column upper GI series was performed using water soluble
contrast.
CONTRAST:  40mL OMNIPAQUE IOHEXOL 300 MG/ML  SOLN

[Series 1: run · 1 of 1 slices shown (1 of 14)]
[im 1/1]
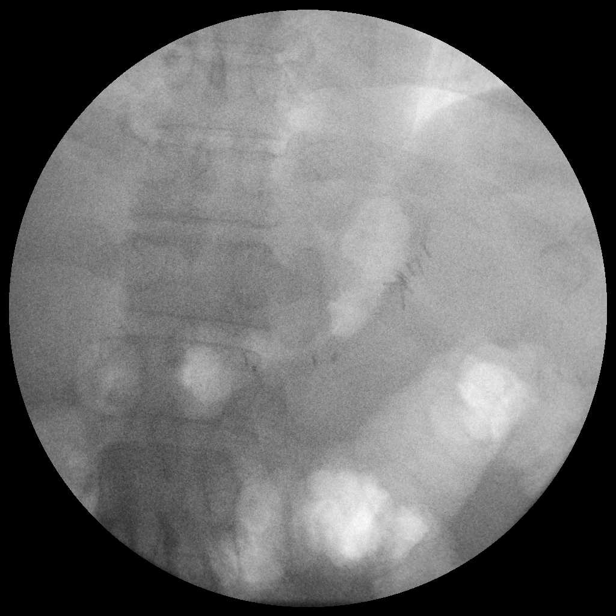

[Series 2: run · 1 of 1 slices shown (2 of 14)]
[im 1/1]
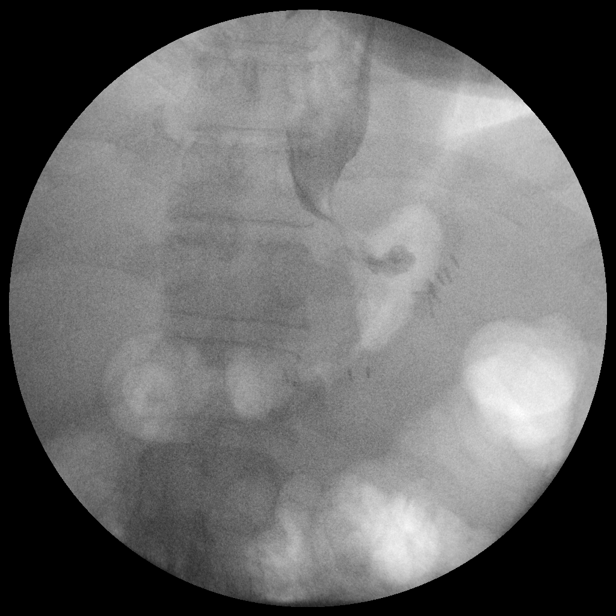

[Series 4: run · 1 of 1 slices shown (3 of 14)]
[im 1/1]
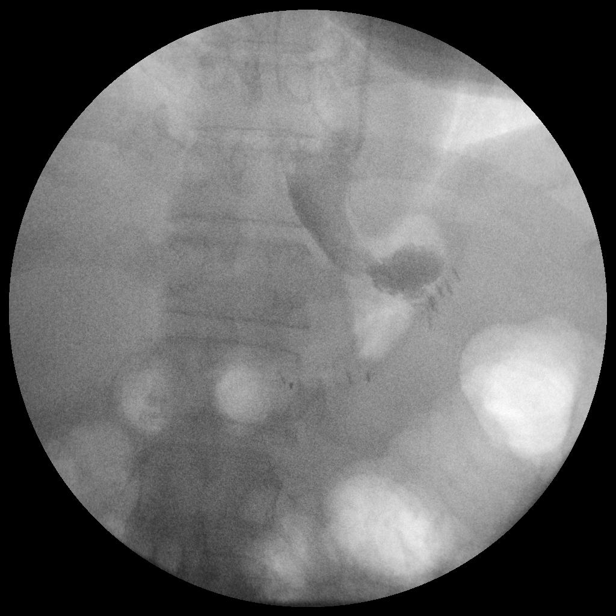

[Series 5: run · 1 of 1 slices shown (4 of 14)]
[im 1/1]
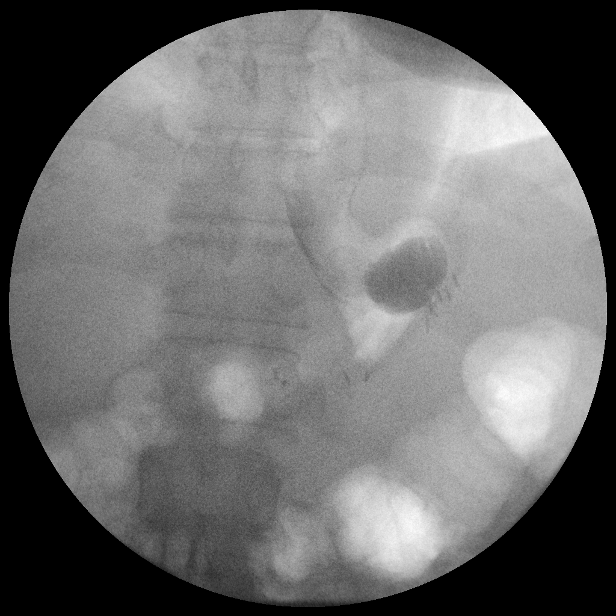

[Series 6: run · 1 of 1 slices shown (5 of 14)]
[im 1/1]
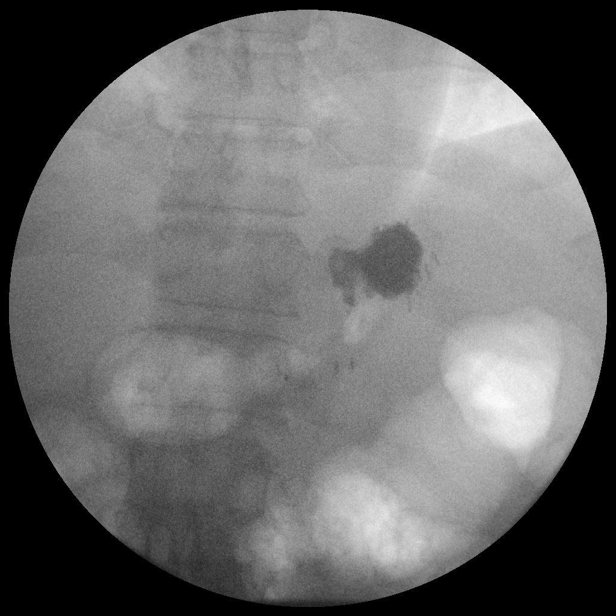

[Series 7: run · 1 of 1 slices shown (6 of 14)]
[im 1/1]
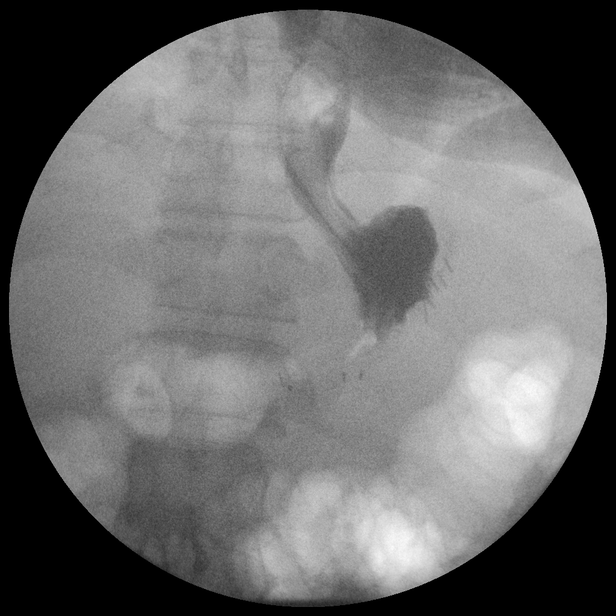

[Series 8: run · 1 of 1 slices shown (7 of 14)]
[im 1/1]
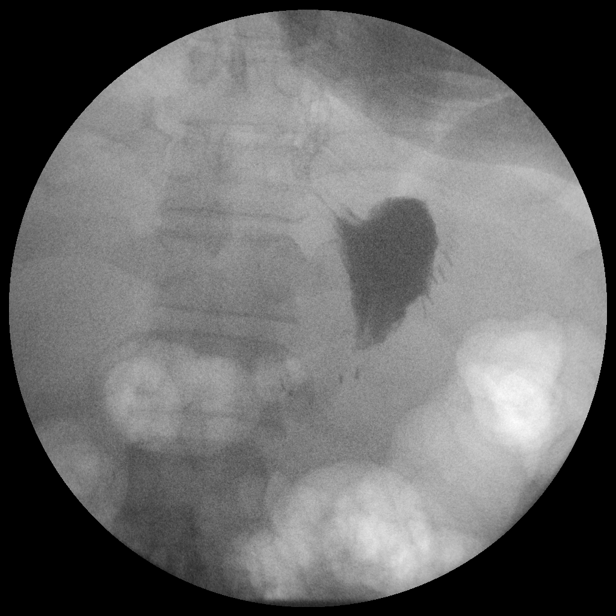

[Series 10: run · 1 of 1 slices shown (8 of 14)]
[im 1/1]
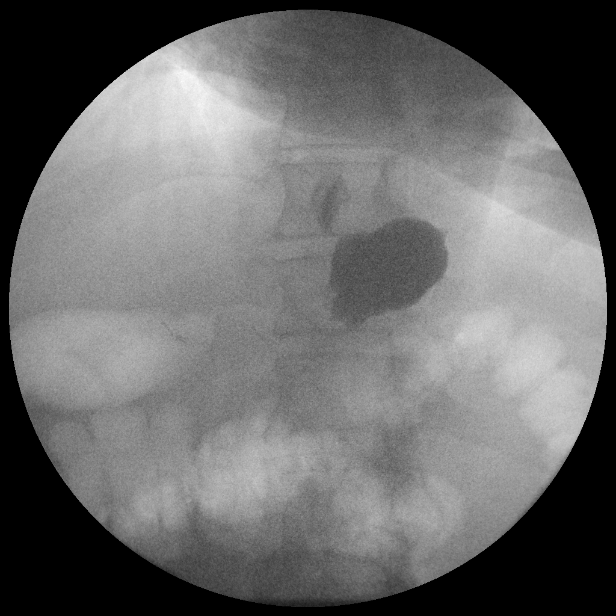

[Series 11: run · 1 of 1 slices shown (9 of 14)]
[im 1/1]
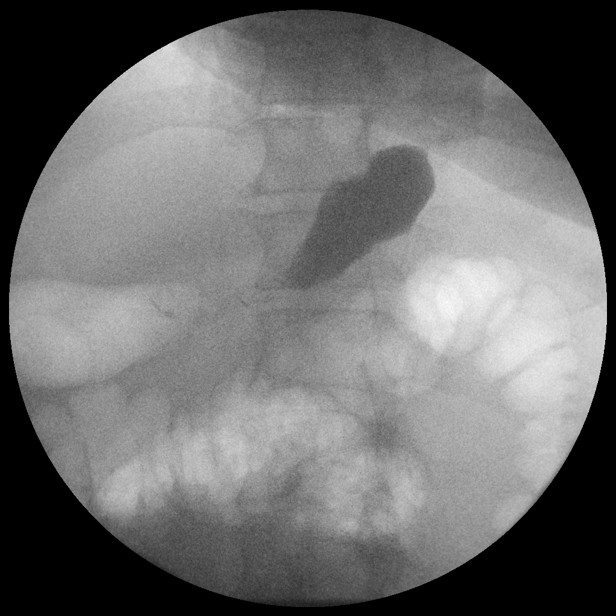

[Series 12: run · 1 of 1 slices shown (10 of 14)]
[im 1/1]
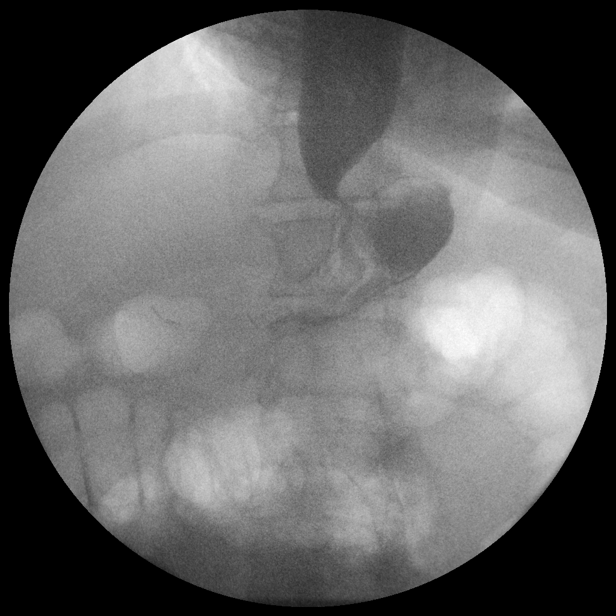

[Series 13: run · 1 of 1 slices shown (11 of 14)]
[im 1/1]
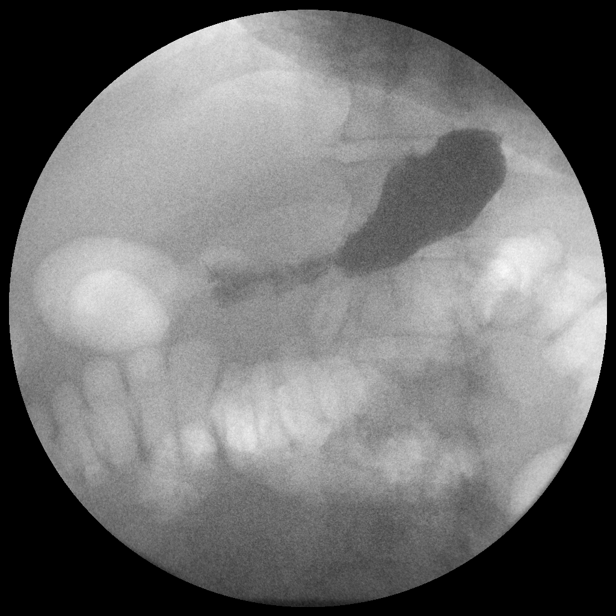

[Series 14: run · 1 of 1 slices shown (12 of 14)]
[im 1/1]
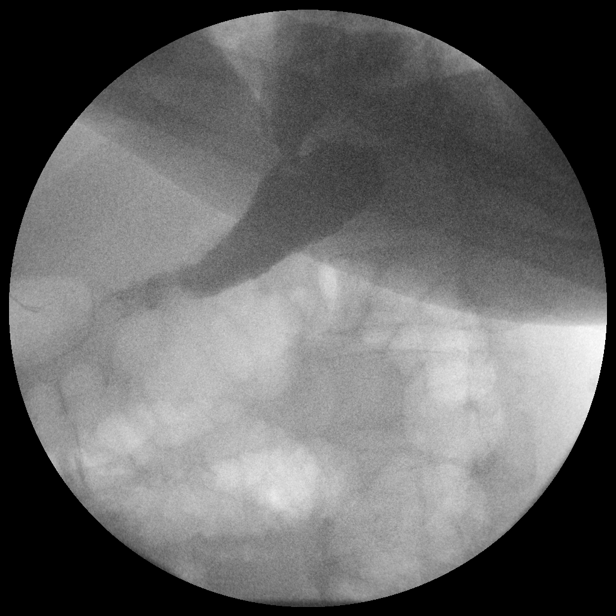

[Series 16: run · 1 of 1 slices shown (13 of 14)]
[im 1/1]
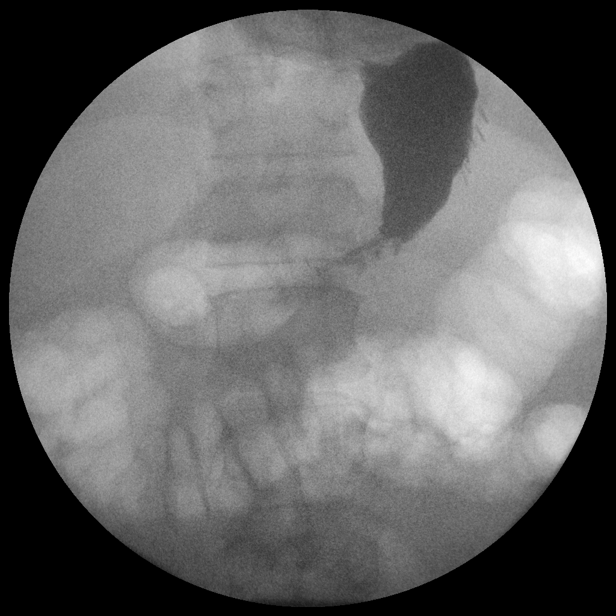

[Series 17: run · 1 of 1 slices shown (14 of 14)]
[im 1/1]
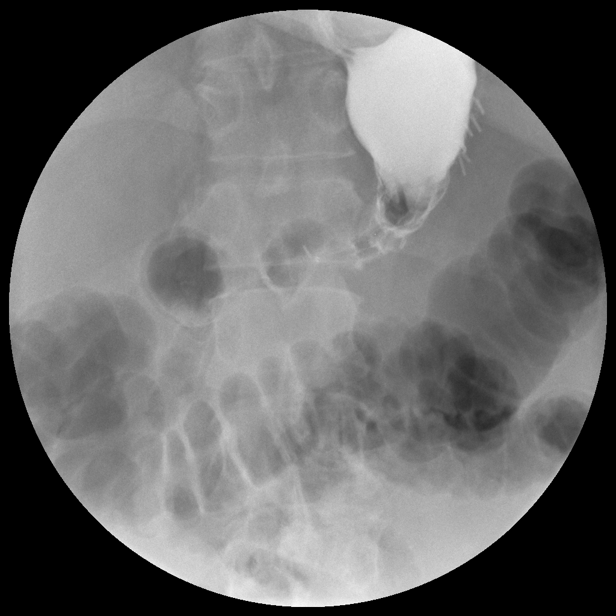

[14 of 17 positions shown; findings below may reference images not displayed]

FLUOROSCOPY TIME:  If the device does not provide the exposure
index:

Fluoroscopy Time (in minutes and seconds):  3 minutes and 22 seconds

Number of Acquired Images:  18
FINDINGS: Initial KUB demonstrated surgical clips in the epigastric region.
Subsequently, following ingestion of water soluble oral contrast,
the typical post sleeve gastrectomy appearance of the stomach was
observed. Contrast readily traversed the pylorus into the proximal
small bowel. No extravasation of contrast was noted.
IMPRESSION: 1. Typical postoperative appearance following sleeve gastrectomy, as
above, without acute complicating features.

## 2017-01-02 ENCOUNTER — Encounter (HOSPITAL_COMMUNITY): Payer: Self-pay

## 2017-06-19 DIAGNOSIS — J4520 Mild intermittent asthma, uncomplicated: Secondary | ICD-10-CM | POA: Diagnosis not present

## 2017-06-19 DIAGNOSIS — Z760 Encounter for issue of repeat prescription: Secondary | ICD-10-CM | POA: Diagnosis not present

## 2017-06-19 DIAGNOSIS — M549 Dorsalgia, unspecified: Secondary | ICD-10-CM | POA: Diagnosis not present

## 2017-07-17 MED FILL — SIMVASTATIN 40 MG TABLET: 40 | 30 days supply | Qty: 30 | Fill #0

## 2017-07-17 MED FILL — VENTOLIN HFA 90 MCG INHALER: 108 (90 BAS | 25 days supply | Qty: 18 | Fill #0

## 2017-07-17 MED FILL — METOPROLOL SUCC ER 50 MG TA: 50 | 30 days supply | Qty: 30 | Fill #0

## 2017-07-25 DIAGNOSIS — Z0000 Encounter for general adult medical examination without abnormal findings: Secondary | ICD-10-CM | POA: Diagnosis not present

## 2017-07-26 DIAGNOSIS — Z0000 Encounter for general adult medical examination without abnormal findings: Secondary | ICD-10-CM | POA: Diagnosis not present

## 2017-07-26 DIAGNOSIS — E669 Obesity, unspecified: Secondary | ICD-10-CM | POA: Diagnosis not present

## 2017-07-26 DIAGNOSIS — Z1211 Encounter for screening for malignant neoplasm of colon: Secondary | ICD-10-CM | POA: Diagnosis not present

## 2017-07-26 DIAGNOSIS — Z9884 Bariatric surgery status: Secondary | ICD-10-CM | POA: Diagnosis not present

## 2017-07-26 DIAGNOSIS — J4520 Mild intermittent asthma, uncomplicated: Secondary | ICD-10-CM | POA: Diagnosis not present

## 2017-07-26 DIAGNOSIS — R739 Hyperglycemia, unspecified: Secondary | ICD-10-CM | POA: Diagnosis not present

## 2017-07-26 DIAGNOSIS — Z6836 Body mass index (BMI) 36.0-36.9, adult: Secondary | ICD-10-CM | POA: Diagnosis not present

## 2017-07-26 DIAGNOSIS — Z760 Encounter for issue of repeat prescription: Secondary | ICD-10-CM | POA: Diagnosis not present

## 2017-07-26 DIAGNOSIS — Z23 Encounter for immunization: Secondary | ICD-10-CM | POA: Diagnosis not present

## 2017-08-09 MED FILL — VENTOLIN HFA 90 MCG INHALER: 108 (90 BAS | 25 days supply | Qty: 18 | Fill #0

## 2017-08-10 MED FILL — METOPROLOL SUCC ER 50 MG TA: 50 | 90 days supply | Qty: 90 | Fill #0

## 2017-08-27 MED FILL — SIMVASTATIN 40 MG TABLET: 40 | 90 days supply | Qty: 90 | Fill #0

## 2017-09-11 DIAGNOSIS — H524 Presbyopia: Secondary | ICD-10-CM | POA: Diagnosis not present

## 2017-11-14 MED FILL — METHOCARBAMOL 750 MG TABS: 750 | 7 days supply | Qty: 14 | Fill #0

## 2017-11-14 MED FILL — VENTOLIN HFA 90 MCG INHALER: 108 (90 BAS | 25 days supply | Qty: 18 | Fill #1

## 2017-11-14 MED FILL — METOPROLOL SUCC ER 50 MG TA: 50 | 30 days supply | Qty: 30 | Fill #0

## 2017-11-14 MED FILL — ETODOLAC 500 MG TABLET: 500 | 10 days supply | Qty: 20 | Fill #0

## 2018-01-01 MED FILL — SIMVASTATIN 40 MG TABLET: 40 | 30 days supply | Qty: 30 | Fill #0

## 2018-01-02 MED FILL — VENTOLIN HFA 90 MCG INHALER: 108 (90 BAS | 16 days supply | Qty: 18 | Fill #0

## 2018-01-03 MED FILL — METOPROLOL TARTRATE 50 MG T: 50 | 30 days supply | Qty: 60 | Fill #0

## 2018-01-03 MED FILL — CLOBETASOL 0.05% OINTMENT: 0.05 | 30 days supply | Qty: 60 | Fill #0

## 2018-01-03 MED FILL — IPRAT-ALBUT 0.5-3(2.5) MG/3: 0.5-2.5 (3) | 30 days supply | Qty: 360 | Fill #0

## 2018-01-17 MED FILL — glipiZIDE 5 MG TABS: 5 | 30 days supply | Qty: 60 | Fill #0

## 2018-01-17 MED FILL — KETOCONAZOLE 2% CREAM: 2 | 30 days supply | Qty: 60 | Fill #0

## 2018-01-17 MED FILL — LISINOPRIL 5 MG TABLET: 5 | 30 days supply | Qty: 30 | Fill #0

## 2018-01-17 MED FILL — AMOXICILLIN 500 MG CAPSULE: 500 | 10 days supply | Qty: 20 | Fill #0

## 2018-02-01 ENCOUNTER — Encounter (HOSPITAL_COMMUNITY): Payer: Self-pay

## 2018-02-19 MED FILL — SIMVASTATIN 40 MG TABLET: 40 | 30 days supply | Qty: 30 | Fill #0

## 2018-02-19 MED FILL — METOPROLOL TARTRATE 50 MG T: 50 | 30 days supply | Qty: 60 | Fill #1

## 2018-03-08 MED FILL — IPRAT-ALBUT 0.5-3(2.5) MG/3: 0.5-2.5 (3) | 30 days supply | Qty: 360 | Fill #1

## 2018-03-08 MED FILL — LISINOPRIL 5 MG TABLET: 5 | 30 days supply | Qty: 30 | Fill #1

## 2018-03-08 MED FILL — glipiZIDE 5 MG TABS: 5 | 30 days supply | Qty: 60 | Fill #1

## 2018-03-08 MED FILL — KETOCONAZOLE 2% CREAM: 2 | 30 days supply | Qty: 60 | Fill #1

## 2018-04-08 MED FILL — METOPROLOL TARTRATE 50 MG T: 50 | 30 days supply | Qty: 60 | Fill #2

## 2018-04-08 MED FILL — SIMVASTATIN 40 MG TABLET: 40 | 30 days supply | Qty: 30 | Fill #1

## 2018-04-25 MED FILL — LISINOPRIL 5 MG TABLET: 5 | 30 days supply | Qty: 30 | Fill #2

## 2018-04-25 MED FILL — glipiZIDE 5 MG TABS: 5 | 30 days supply | Qty: 60 | Fill #2

## 2018-05-15 MED FILL — METOPROLOL TARTRATE 50 MG T: 50 | 30 days supply | Qty: 60 | Fill #0

## 2018-05-15 MED FILL — SIMVASTATIN 40 MG TABLET: 40 | 30 days supply | Qty: 30 | Fill #2

## 2018-05-20 MED FILL — MELOXICAM 7.5 MG TABLET: 7.5 | 30 days supply | Qty: 30 | Fill #0

## 2018-06-17 MED FILL — LISINOPRIL 5 MG TABLET: 5 | 30 days supply | Qty: 30 | Fill #3

## 2018-06-17 MED FILL — glipiZIDE 5 MG TABS: 5 | 30 days supply | Qty: 60 | Fill #3

## 2018-06-18 MED FILL — SIMVASTATIN 40 MG TABLET: 40 | 30 days supply | Qty: 30 | Fill #0

## 2018-06-18 MED FILL — METOPROLOL TARTRATE 50 MG T: 50 | 30 days supply | Qty: 60 | Fill #0

## 2018-07-22 MED FILL — LISINOPRIL 5 MG TABLET: 5 | 90 days supply | Qty: 90 | Fill #0

## 2018-07-22 MED FILL — glipiZIDE 5 MG TABS: 5 | 30 days supply | Qty: 60 | Fill #0

## 2018-07-22 MED FILL — SIMVASTATIN 40 MG TABLET: 40 | 90 days supply | Qty: 90 | Fill #0

## 2018-07-22 MED FILL — METOPROLOL TARTRATE 50 MG T: 50 | 90 days supply | Qty: 180 | Fill #0

## 2018-07-30 MED FILL — MELOXICAM 15 MG TABLET: 15 | 30 days supply | Qty: 30 | Fill #0

## 2018-08-20 MED FILL — glipiZIDE 5 MG TABS: 5 | 30 days supply | Qty: 60 | Fill #1

## 2018-09-24 MED FILL — glipiZIDE 5 MG TABS: 5 | 30 days supply | Qty: 60 | Fill #2

## 2018-10-29 MED FILL — glipiZIDE 5 MG TABS: 5 | 30 days supply | Qty: 60 | Fill #0

## 2018-10-29 MED FILL — SIMVASTATIN 40 MG TABLET: 40 | 90 days supply | Qty: 90 | Fill #0

## 2018-10-29 MED FILL — KETOCONAZOLE 2% CREAM: 2 | 15 days supply | Qty: 60 | Fill #0

## 2018-10-29 MED FILL — LISINOPRIL 5 MG TABLET: 5 | 90 days supply | Qty: 90 | Fill #0

## 2018-10-29 MED FILL — METOPROLOL TARTRATE 50 MG T: 50 | 90 days supply | Qty: 180 | Fill #0

## 2018-10-29 MED FILL — IPRAT-ALBUT 0.5-3(2.5) MG/3: 0.5-2.5 (3) | 30 days supply | Qty: 360 | Fill #0

## 2018-11-01 ENCOUNTER — Ambulatory Visit (INDEPENDENT_AMBULATORY_CARE_PROVIDER_SITE_OTHER): Payer: BLUE CROSS/BLUE SHIELD

## 2018-11-01 ENCOUNTER — Ambulatory Visit (HOSPITAL_COMMUNITY)
Admission: EM | Admit: 2018-11-01 | Discharge: 2018-11-01 | Disposition: A | Discharge status: Home / Self Care | Payer: BLUE CROSS/BLUE SHIELD | Attending: Internal Medicine | Admitting: Internal Medicine

## 2018-11-01 ENCOUNTER — Encounter (HOSPITAL_COMMUNITY): Payer: Self-pay | Admitting: Emergency Medicine

## 2018-11-01 DIAGNOSIS — R0602 Shortness of breath: Secondary | ICD-10-CM | POA: Diagnosis not present

## 2018-11-01 DIAGNOSIS — R0789 Other chest pain: Secondary | ICD-10-CM | POA: Diagnosis not present

## 2018-11-01 DIAGNOSIS — I10 Essential (primary) hypertension: Secondary | ICD-10-CM

## 2018-11-01 MED ORDER — NAPROXEN 500 MG PO TABS: 500.0000 mg | ORAL_TABLET | Freq: Two times a day (BID) | ORAL | 0 refills | Status: AC

## 2018-11-01 MED ORDER — KETOROLAC TROMETHAMINE 30 MG/ML IJ SOLN
INTRAMUSCULAR | Status: AC
  Filled 2018-11-01: qty 1

## 2018-11-01 MED ORDER — KETOROLAC TROMETHAMINE 30 MG/ML IJ SOLN
30.0000 mg | Freq: Once | INTRAMUSCULAR | Status: AC
  Administered 2018-11-01: 30 mg via INTRAMUSCULAR

## 2018-11-01 NOTE — Discharge Instructions (Signed)
Please go to emergency room if having worsening chest pain, worsening shortness of breath, difficulty breathing, worsening pain with exertion  Please take anti-inflammatories in the mean time- Naprosyn twice daily  No heavy lifting

## 2018-11-01 NOTE — ED Provider Notes (Signed)
[facility]    CSN: [id number] Arrival date & time: 11/01/18  1927     History   Chief Complaint Chief Complaint  Patient presents with  . Chest Pain    HPI Paul Le is a 52 y.o. male history of hypertension, hyperlipidemia, DM type II, asthma presenting today for evaluation of chest discomfort.  Patient states that last Sunday, approximately 5 days ago he was picking up leaves from the yard states that he pulled a bag of leaves up and began to feel a discomfort on the right side of his sternum.  Since he has had pain with breathing as well as movement.  Was in [employer] earlier today and symptoms worsened.  He has had associated shortness of breath.  Denies associated cough, fevers or URI symptoms.  Denies headache, vision changes, dizziness or lightheadedness.  Denies sensation of syncope.  No history of early death from MI.  Him and his wife are concerned as they felt a protrusion to the right side of his chest.  Denies previous DVT/PE, denies recent travel or immobilization.  Denies current smoking use, quit in 89.  HPI  Past Medical History:  Diagnosis Date  . Asthma   . Diabetes mellitus without complication (HCC)   . Hyperlipidemia   . Hypertension     Patient Active Problem List   Diagnosis Date Noted  . Morbid obesity (HCC) 06/28/2015    Past Surgical History:  Procedure Laterality Date  . APPENDECTOMY    . LAPAROSCOPIC GASTRIC SLEEVE RESECTION N/A 06/28/2015   Procedure: LAPAROSCOPIC GASTRIC SLEEVE RESECTION WITH UPPER ENDOSCOPY;  Surgeon: Glenna FellowsBenjamin Hoxworth, MD;  Location: [facility];  Service: General;  Laterality: N/A;       Home Medications    Prior to Admission medications   Medication Sig Start Date End Date Taking? Authorizing Provider  amLODipine (NORVASC) 5 MG tablet Take 5 mg by mouth daily.    [provider]  aspirin 81 MG tablet Take 81 mg by mouth daily.    [provider]  atorvastatin (LIPITOR) 10 MG tablet  Take 10 mg by mouth at bedtime.     [provider]  Calcium Carbonate-Vitamin D (CALCIUM + D PO) Take 1 tablet by mouth daily.    [provider]  Chromium-Cinnamon [phone] MCG-MG CAPS Take 1 capsule by mouth 2 (two) times daily.    [provider]  Cyanocobalamin (B12 LIQUID HEALTH BOOSTER PO) Take 1 mL by mouth daily.    [provider]  diphenhydramine-acetaminophen (TYLENOL PM) 25-500 MG TABS Take 2 tablets by mouth at bedtime as needed (sleep).    [provider]  glucose blood test strip Use as instructed 06/30/15   Glenna FellowsHoxworth, Benjamin, MD  hydrochlorothiazide (HYDRODIURIL) 25 MG tablet Take 25 mg by mouth daily.    [provider]  Boris LownKrill Oil 300 MG CAPS Take 1 capsule by mouth daily.    [provider]  metoprolol succinate (TOPROL-XL) 50 MG 24 hr tablet Take 50 mg by mouth at bedtime. Take with or immediately following a meal.    [provider]  Multiple Vitamin (MULTIVITAMIN WITH MINERALS) TABS tablet Take 1 tablet by mouth daily.    [provider]  naproxen (NAPROSYN) 500 MG tablet Take 1 tablet (500 mg total) by mouth 2 (two) times daily. 11/01/18   Rhoderick Farrel, Junius CreamerHallie C, PA-C    Family History No family history on file.  Social History Social History   Tobacco Use  . Smoking  status: Never Smoker  Substance Use Topics  . Alcohol use: No  . Drug use: No     Allergies   Patient has no known allergies.   Review of Systems Review of Systems  Constitutional: Negative for fatigue and fever.  HENT: Negative for congestion, sinus pressure and sore throat.   Eyes: Negative for photophobia, pain and visual disturbance.  Respiratory: Positive for shortness of breath. Negative for cough.   Cardiovascular: Positive for chest pain.  Gastrointestinal: Negative for abdominal pain, nausea and vomiting.  Genitourinary: Negative for decreased urine volume and hematuria.  Musculoskeletal: Negative for  myalgias, neck pain and neck stiffness.  Neurological: Negative for dizziness, syncope, facial asymmetry, speech difficulty, weakness, light-headedness, numbness and headaches.     Physical Exam Triage Vital Signs ED Triage Vitals  Enc Vitals Group     BP 11/01/18 1947 (!) 156/94     Pulse Rate 11/01/18 1947 83     Resp 11/01/18 1947 16     Temp 11/01/18 1947 (!) 97.4 F (36.3 C)     Temp src --      SpO2 11/01/18 1947 96 %     Weight --      Height --      Head Circumference --      Peak Flow --      Pain Score 11/01/18 1949 10     Pain Loc --      Pain Edu? --      Excl. in GC? --    No data found.  Updated Vital Signs BP (!) 156/94   Pulse 83   Temp (!) 97.4 F (36.3 C)   Resp 16   SpO2 96%   Visual Acuity Right Eye Distance:   Left Eye Distance:   Bilateral Distance:    Right Eye Near:   Left Eye Near:    Bilateral Near:     Physical Exam  Constitutional: He is oriented to person, place, and time. He appears well-developed and well-nourished.  HENT:  Head: Normocephalic and atraumatic.  Mouth/Throat: Oropharynx is clear and moist.  Eyes: Pupils are equal, round, and reactive to light. Conjunctivae and EOM are normal.  No photophobia  Neck: Neck supple.  Cardiovascular: Normal rate and regular rhythm.  No murmur heard. Pulmonary/Chest: Effort normal and breath sounds normal. No respiratory distress.  Breathing comfortably at rest, CTABL, no wheezing, rales or other adventitious sounds auscultated  Right sternal border or more swollen and protruding greater than left sternal border, mild tenderness to palpation  Abdominal: Soft. There is no tenderness.  Musculoskeletal: He exhibits no edema.  Neurological: He is alert and oriented to person, place, and time.  Patient A&O x3, cranial nerves II-XII grossly intact, strength at shoulders, hips and knees 5/5, equal bilaterally, patellar reflex 2+ bilaterally. Normal Finger to nose, RAM and heel to shin.  Negative Romberg and Pronator Drift. Gait without abnormality.  Skin: Skin is warm and dry.  Psychiatric: He has a normal mood and affect.  Nursing note and vitals reviewed.    UC Treatments / Results  Labs (all labs ordered are listed, but only abnormal results are displayed) Labs Reviewed - No data to display  EKG None  Radiology Dg Chest 2 View  Result Date: 11/01/2018 CLINICAL DATA:  52 y/o M; 5 days of chest wall pain. Bony abnormality at the right sternal border. EXAM: CHEST - 2 VIEW COMPARISON:  04/12/2015 chest radiograph. FINDINGS: Stable normal cardiac silhouette given projection and technique. No  consolidation, effusion, or pneumothorax. Postsurgical changes near the gastroesophageal junction. Mild degenerative changes of the thoracic spine. No acute osseous abnormality is evident. IMPRESSION: No acute pulmonary process identified. No acute osseous abnormality is evident. If clinically indicated consider dedicated rib radiographs to the side of pain. Electronically Signed   By: Mitzi Hansen M.D.   On: 11/01/2018 20:42    Procedures Procedures (including critical care time)  Medications Ordered in UC Medications  ketorolac (TORADOL) 30 MG/ML injection 30 mg (30 mg Intramuscular Given 11/01/18 2039)    Initial Impression / Assessment and Plan / UC Course  I have reviewed the triage vital signs and the nursing notes.  Pertinent labs & imaging results that were available during my care of the patient were reviewed by me and considered in my medical decision making (see chart for details).     EKG normal sinus rhythm, similar to previous EKG, no acute signs of ischemia or infarction.  Chest x-ray negative.  No bony abnormality appreciated.  Given chest pain reproducible and worsens with movement, improved with Toradol injection, will treat as musculoskeletal.  Continue Naprosyn and anti-inflammatories.  Follow-up in ED if symptoms worsening, developing worsening  chest discomfort, not improving, shortness of breath or difficulty breathing.Discussed strict return precautions. Patient verbalized understanding and is agreeable with plan.  Final Clinical Impressions(s) / UC Diagnoses   Final diagnoses:  Atypical chest pain     Discharge Instructions     Please go to emergency room if having worsening chest pain, worsening shortness of breath, difficulty breathing, worsening pain with exertion  Please take anti-inflammatories in the mean time- Naprosyn twice daily  No heavy lifting   ED Prescriptions    Medication Sig Dispense Auth. Provider   naproxen (NAPROSYN) 500 MG tablet Take 1 tablet (500 mg total) by mouth 2 (two) times daily. 30 tablet Lesley Atkin, Paden C, PA-C     Controlled Substance Prescriptions [place] Controlled Substance Registry consulted? Not Applicable   Lew Dawes, [place] 11/02/18 1637

## 2018-11-01 NOTE — ED Triage Notes (Signed)
Pt states on Sunday he was "grinding some leaves". Pt states he has a vacuum to pick up leaves? Pt states he pulled the bag of leaves up, tender and painful to palpation over sternum. Pt states he feels like he pulled something.

## 2018-12-13 MED FILL — glipiZIDE 5 MG TABS: 5 | 30 days supply | Qty: 120 | Fill #0

## 2018-12-30 MED FILL — SUPREP BOWEL PREP KIT: 17.5-3.13-1 | 1 days supply | Qty: 354 | Fill #0

## 2019-01-21 MED FILL — glipiZIDE 5 MG TABS: 5 | 30 days supply | Qty: 120 | Fill #1

## 2019-01-21 MED FILL — LISINOPRIL 5 MG TABLET: 5 | 30 days supply | Qty: 30 | Fill #0

## 2019-01-29 MED FILL — SIMVASTATIN 40 MG TABLET: 40 | 90 days supply | Qty: 90 | Fill #0

## 2019-01-29 MED FILL — METOPROLOL TARTRATE 50 MG T: 50 | 90 days supply | Qty: 180 | Fill #0

## 2019-03-07 MED FILL — glipiZIDE 5 MG TABS: 5 | 30 days supply | Qty: 120 | Fill #2 | Status: TO

## 2019-03-07 MED FILL — LISINOPRIL 5 MG TABLET: 5 | 90 days supply | Qty: 90 | Fill #0

## 2019-04-10 MED FILL — glipiZIDE 5 MG TABS: 5 | 30 days supply | Qty: 120 | Fill #0

## 2019-05-09 MED FILL — glipiZIDE 5 MG TABS: 5 | 30 days supply | Qty: 120 | Fill #1

## 2019-05-15 MED FILL — METOPROLOL TARTRATE 50 MG T: 50 | 90 days supply | Qty: 180 | Fill #1

## 2020-07-02 ENCOUNTER — Other Ambulatory Visit (INDEPENDENT_AMBULATORY_CARE_PROVIDER_SITE_OTHER): Payer: Self-pay | Admitting: Surgery

## 2020-07-02 ENCOUNTER — Other Ambulatory Visit (HOSPITAL_COMMUNITY): Payer: Self-pay | Admitting: Surgery

## 2020-07-14 ENCOUNTER — Ambulatory Visit (HOSPITAL_COMMUNITY)
Admission: RE | Admit: 2020-07-14 | Discharge: 2020-07-14 | Disposition: A | Discharge status: Home / Self Care | Payer: Federal, State, Local not specified - PPO | Source: Ambulatory Visit | Attending: Surgery | Admitting: Surgery

## 2020-07-14 ENCOUNTER — Other Ambulatory Visit: Payer: Self-pay

## 2020-07-14 DIAGNOSIS — Z6841 Body Mass Index (BMI) 40.0 and over, adult: Secondary | ICD-10-CM | POA: Insufficient documentation

## 2020-08-25 ENCOUNTER — Encounter: Payer: Managed Care, Other (non HMO) | Attending: Surgery | Admitting: Skilled Nursing Facility1

## 2020-08-25 ENCOUNTER — Other Ambulatory Visit: Payer: Self-pay

## 2020-08-25 NOTE — Progress Notes (Signed)
Nutrition Assessment for Bariatric Surgery Medical Nutrition Therapy Appt Start Time: 2:06    End Time: 3:01  Patient was seen on 08/25/2020 for Pre-Operative Nutrition Assessment. Letter of approval faxed to [facility] Surgery bariatric surgery program coordinator on 08/25/2020   Referral stated Supervised Weight Loss (SWL) visits needed: 0  Planned surgery: Sleeve Gastrectomy conversion to a Sleeve Gastrectomy  Pt expectation of surgery: to correct reflux Pt expectation of dietitian: none identified     NUTRITION ASSESSMENT   Anthropometrics  Start weight at [facility]: 229 lbs (date: 08/25/2020)  Height: 66 in BMI: 36.96 kg/m2     Clinical  Medical hx: GERD Medications: prilosec  Labs: A1C 10.6 Notable signs/symptoms: reflux/vomtiing  Any previous deficiencies? No  Micronutrient Nutrition Focused Physical Exam: Hair: No issues observed Eyes: No issues observed Mouth: No issues observed Neck: No issues observed Nails: No issues observed Skin: No issues observed  Lifestyle & Dietary Hx  Pt states due to excessive reflux he does not know when he is hungry. Pt states he is taking prilosec 2-3 times a day. Pt states he cannot breath from the reflux at night and feels he is going to die and states he is very scared. Pt states he never came back for follow up due to living in [place]. Pt states he just did not know what to do and googled but could not figure out what to do and how to fix it: Dietitian educated pt on calling his surgeon and following up. Pt states he checks his blood sugar on and off lately it was every day: stating he stops taking his blood sugar because they are always high which scare him: 210 fasting never checking after eating. Pt states he has a very stressful job.  Pt states he gets low energy and dizzy multiple times a week due to going too long without eating. Pt states everything he eats is on the run. Pt states before every meeting at work he has anxiety  reflux needing to run to the bathroom to vomit.  Dietitian educated pt on pathophysiology of diabetes and how he is eating affects his blood sugars.   24-Hr Dietary Recall First Meal: coffee + splenda + creamer + white castle slider  Snack: chips Snack: half pimento cheese sandwich  Second Meal: frozen meal Snack: white Government social research officer Third Meal: beef stew + rice Snack: white castle slider or 1 slice bread + cheese or pimento cheese Beverages: coffee, water, unsweet tea   Estimated Energy Needs Calories: 1600  NUTRITION DIAGNOSIS  Overweight/obesity ([place]-3.3) related to past poor dietary habits and physical inactivity as evidenced by patient w/ planned Sleeve Gastrectomy Conversion to Sleeve Gastrectomy surgery following dietary guidelines for continued weight loss.    NUTRITION INTERVENTION  Nutrition counseling (C-1) and education (E-2) to facilitate bariatric surgery goals.   Pre-Op Goals Reviewed with the Patient . Track food and beverage intake (pen and paper, MyFitness Pal, Baritastic app, etc.) . Make healthy food choices while monitoring portion sizes . Consume 3 meals per day or try to eat every 3-5 hours . Avoid concentrated sugars and fried foods . Keep sugar & fat in the single digits per serving on food labels . Practice CHEWING your food (aim for applesauce consistency) . Practice not drinking 15 minutes before, during, and 30 minutes after each meal and snack . Avoid all carbonated beverages (ex: soda, sparkling beverages)  . Limit caffeinated beverages (ex: coffee, tea, energy drinks) . Avoid all sugar-sweetened beverages (ex: regular  soda, sports drinks)  . Avoid alcohol  . Aim for 64-100 ounces of FLUID daily (with at least half of fluid intake being plain water)  . Aim for at least 60-80 grams of PROTEIN daily . Look for a liquid protein source that contains ?15 g protein and ?5 g carbohydrate (ex: shakes, drinks, shots) . Make a list of non-food related  activities . Physical activity is an important part of a healthy lifestyle so keep it moving! The goal is to reach 150 minutes of exercise per week, including cardiovascular and weight baring activity.  *Goals that are bolded indicate the pt would like to start working towards these  Handouts Provided Include  . Bariatric Surgery handouts (Nutrition Visits, Pre-Op Goals, Protein Shakes, Vitamins & Minerals)  Learning Style & Readiness for Change Teaching method utilized: Visual & Auditory  Demonstrated degree of understanding via: Teach Back  Barriers to learning/adherence to lifestyle change: pre contemplative stage of change     MONITORING & EVALUATION Dietary intake, weekly physical activity, body weight, and pre-op goals reached at next nutrition visit.    Next Steps  Patient is to follow up at [facility] for Pre-Op Class >2 weeks before surgery for further nutrition education.

## 2020-09-17 ENCOUNTER — Encounter: Payer: Self-pay | Admitting: Dietician

## 2020-09-17 ENCOUNTER — Encounter: Payer: Managed Care, Other (non HMO) | Attending: Surgery | Admitting: Dietician

## 2020-09-17 ENCOUNTER — Other Ambulatory Visit: Payer: Self-pay

## 2020-09-17 NOTE — Progress Notes (Signed)
Supervised Weight Loss Visit Bariatric Nutrition Education  Planned Surgery: Sleeve Gastrectomy to RYGB   Pt Expectation of Surgery/ Goals: to correct reflux   1 out of 3 SWL Appointments    NUTRITION ASSESSMENT  Anthropometrics  Start weight at [facility]: 229 lbs (date: 08/25/2020) Today's weight: 230 lbs BMI: 37.1 kg/m2    Lifestyle & Dietary Hx Patient states his reflux is still severe, and he is afraid to sleep at night because it is hard for him to breathe. States he switched to decaf coffee, however it did not improve his acid reflux. States he doesn't eat much at a time, sticking to small meals. Avoids acidic and spicy foods. Drinks lots of water and is physically active during the day.   Estimated daily fluid intake: 64+ oz Supplements: vitamin, iron, calcium 3x/day  Current average weekly physical activity: walking Engineer, mining, so lots of walking/stairs at work)   24-Hr Dietary Recall First Meal: slider  Snack: banana Second Meal: chicken/beef stew Snack: slider Third Meal: grilled chicken salad Snack: - Beverages: water, coffee, decaf coffee   NUTRITION DIAGNOSIS  Overweight/obesity ([place]-3.3) related to past poor dietary habits and physical inactivity as evidenced by patient w/ planned Sleeve to RYGB conversion surgery following dietary guidelines for continued weight loss.   NUTRITION INTERVENTION  Nutrition counseling (C-1) and education (E-2) to facilitate bariatric surgery goals.  Learning Style & Readiness for Change Teaching method utilized: Visual & Auditory  Demonstrated degree of understanding via: Teach Back  Barriers to learning/adherence to lifestyle change: None Identified   MONITORING & EVALUATION Dietary intake, weekly physical activity, body weight, and pre-op goals.   Next Steps  Patient is to return to [facility] for 2nd SWL visit.

## 2020-10-22 ENCOUNTER — Encounter: Payer: Self-pay | Admitting: Dietician

## 2020-10-22 ENCOUNTER — Encounter: Payer: Managed Care, Other (non HMO) | Attending: Surgery | Admitting: Dietician

## 2020-10-22 ENCOUNTER — Other Ambulatory Visit: Payer: Self-pay

## 2020-10-22 NOTE — Progress Notes (Signed)
Supervised Weight Loss Visit Bariatric Nutrition Education  Planned Surgery: Sleeve Gastrectomy to RYGB   Pt Expectation of Surgery/ Goals: to correct reflux   2 out of 3 SWL Appointments    NUTRITION ASSESSMENT  Anthropometrics  Start weight at [facility]: 229 lbs (date: 08/25/2020) Today's weight: 230.5 lbs BMI: 37.2 kg/m2    Lifestyle & Dietary Hx Patient states his reflux is still severe, and he is afraid to sleep at night because it is hard for him to breathe. States he will sleep with 4 pillows to prop him up which helps a little. States he doesn't eat much at a time, sticking to small meals. Avoids acidic and spicy foods. Drinks lots of water and is physically active during the day. Asked about taking MVI and iron supplements after surgery, so I provided some samples for him to try.   Estimated daily fluid intake: 64+ oz Supplements: vitamin, iron, calcium 3x/day  Current average weekly physical activity: walking Engineer, mining, so lots of walking/stairs at work)   24-Hr Dietary Recall First Meal: slider  Snack: banana Second Meal: chicken/beef stew Snack: slider Third Meal: grilled chicken salad Snack: - Beverages: water, coffee, decaf coffee   NUTRITION DIAGNOSIS  Overweight/obesity ([place]-3.3) related to past poor dietary habits and physical inactivity as evidenced by patient w/ planned Sleeve to RYGB conversion surgery following dietary guidelines for continued weight loss.   NUTRITION INTERVENTION  Nutrition counseling (C-1) and education (E-2) to facilitate bariatric surgery goals.  Learning Style & Readiness for Change Teaching method utilized: Visual & Auditory  Demonstrated degree of understanding via: Teach Back  Barriers to learning/adherence to lifestyle change: None Identified   MONITORING & EVALUATION Dietary intake, weekly physical activity, body weight, and pre-op goals.   Next Steps  Patient is to return to [facility] for 3rd SWL visit.

## 2020-11-19 ENCOUNTER — Encounter: Payer: Self-pay | Admitting: Dietician

## 2020-11-19 ENCOUNTER — Other Ambulatory Visit: Payer: Self-pay

## 2020-11-19 ENCOUNTER — Encounter: Payer: Managed Care, Other (non HMO) | Attending: Surgery | Admitting: Dietician

## 2020-11-19 DIAGNOSIS — E669 Obesity, unspecified: Secondary | ICD-10-CM | POA: Insufficient documentation

## 2020-11-19 NOTE — Progress Notes (Signed)
Supervised Weight Loss Visit Bariatric Nutrition Education  Planned Surgery: Sleeve Gastrectomy to RYGB   Pt Expectation of Surgery/ Goals: to correct reflux   3 out of 3 SWL Appointments    NUTRITION ASSESSMENT  Anthropometrics  Start weight at [facility]: 229 lbs (date: 08/25/2020) Today's weight: 230 lbs BMI: 37.1 kg/m2     Lifestyle & Dietary Hx Patient states his reflux is still severe, and he is afraid to eat too late or even sleep at night because it is hard for him to breathe. Continues to do well with eating small portion sizes. Avoids acidic and spicy foods. Drinks lots of water and is physically active during the day. States he tried the bariatric MVI samples and ordered chewables.   Estimated daily fluid intake: 64+ oz Supplements: vitamin, iron, calcium 3x/day  Current average weekly physical activity: walking Engineer, mining, so lots of walking/stairs at work)   24-Hr Dietary Recall First Meal: slider  Snack: banana Second Meal: chicken/beef stew Snack: slider Third Meal: grilled chicken salad Snack: - Beverages: water, coffee, decaf coffee   NUTRITION DIAGNOSIS  Overweight/obesity ([place]-3.3) related to past poor dietary habits and physical inactivity as evidenced by patient w/ planned Sleeve to RYGB conversion surgery following dietary guidelines for continued weight loss.   NUTRITION INTERVENTION  Nutrition counseling (C-1) and education (E-2) to facilitate bariatric surgery goals.  Learning Style & Readiness for Change Teaching method utilized: Visual & Auditory  Demonstrated degree of understanding via: Teach Back  Barriers to learning/adherence to lifestyle change: None Identified   MONITORING & EVALUATION Dietary intake, weekly physical activity, body weight, and pre-op goals.   Next Steps  Patient is to return to [facility] for Pre-Op Class >2 weeks prior to surgery. From a nutritional standpoint, patient appears to be ready and an appropriate candidate for bariatric  surgery.

## 2020-12-20 ENCOUNTER — Other Ambulatory Visit: Payer: Self-pay

## 2020-12-20 ENCOUNTER — Encounter: Payer: Managed Care, Other (non HMO) | Attending: Surgery | Admitting: Skilled Nursing Facility1

## 2020-12-22 NOTE — Progress Notes (Signed)
Pre-Operative Nutrition Class:  Appt start time: 0228   End time:  1830.  Patient was seen on 12/20/2020 for Pre-Operative Bariatric Surgery [facility]    Surgery date:  Surgery type: sleeve to RYGB Start weight at [facility]: 229 Weight today: 225.3  Samples given per MNT protocol. Patient educated on appropriate usage: Celebrate Calcium  Lot # [id number] Exp:07/08/2021  Procare Multivitamin Lot #[id number] Exp:12/2021  Protein02 drink Shake Lot #[id number] Exp: 10/23  The following the learning objectives were met by the patient during this course:  Identify Pre-Op Dietary Goals and will begin 2 weeks pre-operatively  Identify appropriate sources of fluids and proteins   State protein recommendations and appropriate sources pre and post-operatively  Identify Post-Operative Dietary Goals and will follow for 2 weeks post-operatively  Identify appropriate multivitamin and calcium sources  Describe the need for physical activity post-operatively and will follow MD recommendations  State when to call healthcare provider regarding medication questions or post-operative complications  Handouts given during class include:  Pre-Op Bariatric Surgery Diet Handout  Protein Shake Handout  Post-Op Bariatric Surgery Nutrition Handout  [facility] Program Information Flyer  Support Group Information Flyer  [facility] Bariatric Supplements Price List  Follow-Up Plan: Patient will follow-up at [facility] 2 weeks post operatively for diet advancement per MD.
